# Patient Record
Sex: Male | Born: 1980 | Race: White | Hispanic: No | Marital: Married | Smoking: Never smoker
Health system: Southern US, Community
[De-identification: ages and names within clinical notes are randomized; demographics above are authoritative.]

## PROBLEM LIST (undated history)

## (undated) DIAGNOSIS — I8390 Asymptomatic varicose veins of unspecified lower extremity: Secondary | ICD-10-CM

## (undated) DIAGNOSIS — B019 Varicella without complication: Secondary | ICD-10-CM

## (undated) DIAGNOSIS — I7300 Raynaud's syndrome without gangrene: Secondary | ICD-10-CM

## (undated) DIAGNOSIS — L941 Linear scleroderma: Secondary | ICD-10-CM

## (undated) DIAGNOSIS — I10 Essential (primary) hypertension: Secondary | ICD-10-CM

## (undated) HISTORY — PX: NO PAST SURGERIES: SHX2092

## (undated) HISTORY — DX: Varicella without complication: B01.9

## (undated) HISTORY — DX: Essential (primary) hypertension: I10

## (undated) HISTORY — DX: Linear scleroderma: L94.1

## (undated) HISTORY — DX: Raynaud's syndrome without gangrene: I73.00

## (undated) HISTORY — DX: Asymptomatic varicose veins of unspecified lower extremity: I83.90

---

## 2010-05-26 ENCOUNTER — Emergency Department: Payer: Self-pay | Admitting: Emergency Medicine

## 2012-09-07 ENCOUNTER — Encounter: Payer: Self-pay | Admitting: Adult Health

## 2012-09-07 ENCOUNTER — Ambulatory Visit (INDEPENDENT_AMBULATORY_CARE_PROVIDER_SITE_OTHER): Payer: BC Managed Care – PPO | Admitting: Adult Health

## 2012-09-07 VITALS — BP 114/70 | HR 64 | Temp 98.3°F | Resp 14 | Ht 70.0 in | Wt 179.0 lb

## 2012-09-07 DIAGNOSIS — Z0000 Encounter for general adult medical examination without abnormal findings: Secondary | ICD-10-CM

## 2012-09-07 DIAGNOSIS — Z9189 Other specified personal risk factors, not elsewhere classified: Secondary | ICD-10-CM

## 2012-09-07 DIAGNOSIS — Z87898 Personal history of other specified conditions: Secondary | ICD-10-CM

## 2012-09-07 DIAGNOSIS — Z832 Family history of diseases of the blood and blood-forming organs and certain disorders involving the immune mechanism: Secondary | ICD-10-CM

## 2012-09-07 NOTE — Assessment & Plan Note (Signed)
Patient has been told by his wife that he snores. Discussed having a sleep study to evaluate if he is experiencing any sleep apnea. He is agreeable to have this done at a later time given his busy work schedule at the moment.

## 2012-09-07 NOTE — Patient Instructions (Addendum)
   Thank you for choosing [facility] [place] for your health care needs.  Please return for fasting labs.  The results will be available through MyChart for your convenience. Please remember to activate this. The activation code is located at the end of this form. 

## 2012-09-07 NOTE — Assessment & Plan Note (Signed)
Check factor V leiden

## 2012-09-07 NOTE — Assessment & Plan Note (Signed)
Normal physical exam. Check cbc w/diff, cmet, lipids, tsh. Patient will return for fasting labs.

## 2012-09-07 NOTE — Progress Notes (Signed)
Subjective:    Patient ID: Edwin Brady, male    DOB: 04-05-1980, 32 y.o.   MRN: [id number]  HPI  Patient is a pleasant, healthy 32 year old male who presents to clinic to establish care. Patient recently moved to the area with his family. He has not seen a PCP in several years. He reports that his wife mentions that he snores. He does have periods of fatigue but he attributes these to his work and living with 3 small children. Patient also mentions that he has varicose veins on his right calf that have been evaluated by vascular surgeon. He wears compression socks on occasion but is not really consistent with this.    Past Medical History  Diagnosis Date  . Chicken pox   . Linear scleroderma   . Varicose vein     right calf  . Raynaud disease     Past Surgical Hx: None   Family History  Problem Relation Age of Onset  . Asthma Brother     Childhood asthma    History   Social History  . Marital Status: Married    Spouse Name: Edwin Brady    Number of Children: 3  . Years of Education: 16   Occupational History  . Film maker     [employer]   Social History Main Topics  . Smoking status: Never Smoker   . Smokeless tobacco: Not on file  . Alcohol Use: 0.0 oz/week    8-10 Cans of beer per week  . Drug Use: No  . Sexually Active: Yes -- Male partner(s)   Other Topics Concern  . Not on file   Social History Narrative   Edwin Brady [place] up "[place] over". His father was in the [employer]. He was born in [place]. He has lived in [place], [place], [place]. He graduated from the [employer] with a Bachelor in [place] with a minor in finance. 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 He currently lives in [place] with wife of 5 years and 3 children ages 24, 43 & 8 months. Abdishakur enjoys Diplomatic Services operational officer. He enjoys spending time with the kids. He also enjoys Event organiser. Whenever he has a chance he enjoys running.      No  Medications   Review of Systems  Constitutional: Negative.   HENT: Positive for sneezing.        He has been told by his wife that he snores.  Eyes:       Vision exam every 2 years. He wears eyeglasses without much change in his vision.  Respiratory: Negative.   Cardiovascular: Negative.   Gastrointestinal: Positive for constipation. Negative for nausea, vomiting, abdominal pain, diarrhea and blood in stool.  Endocrine: Negative.   Genitourinary: Negative.   Musculoskeletal: Negative.   Allergic/Immunologic:       Seasonal allergies but very mild  Neurological: Negative.   Hematological: Negative.   Psychiatric/Behavioral: Negative.    BP 114/70  Pulse 64  Temp(Src) 98.3 F (36.8 C)  Resp 14  Ht 5\' 10"  (1.778 m)  Wt 179 lb (81.194 kg)  BMI 25.68 kg/m2    Objective:   Physical Exam  Constitutional: He is oriented to person, place, and time. He appears well-developed and well-nourished. No distress.  HENT:  Head: Normocephalic and atraumatic.  Nose: Nose normal.  Mouth/Throat: Oropharynx is clear and moist.  Erythema and slight inflammation of bilateral ear canals.  Eyes: Conjunctivae and EOM are normal. Pupils  are equal, round, and reactive to light.  Neck: Normal range of motion. Neck supple. No tracheal deviation present. No thyromegaly present.  Cardiovascular: Normal rate, regular rhythm, normal heart sounds and intact distal pulses.  Exam reveals no gallop and no friction rub.   No murmur heard. Right calf varicose veins.  Pulmonary/Chest: Effort normal and breath sounds normal. No respiratory distress. He has no wheezes. He has no rales.  Abdominal: Soft. Bowel sounds are normal. He exhibits no distension and no mass. There is no tenderness. There is no rebound and no guarding.  Musculoskeletal: Normal range of motion. He exhibits no edema and no tenderness.  Lymphadenopathy:    He has no cervical adenopathy.  Neurological: He is alert and oriented to person,  place, and time. He has normal reflexes. He displays normal reflexes. No cranial nerve deficit. He exhibits normal muscle tone. Coordination normal.  Skin: Skin is warm and dry. No rash noted. No erythema. No pallor.  Psychiatric: He has a normal mood and affect. His behavior is normal. Judgment and thought content normal.      Assessment & Plan:

## 2012-09-10 ENCOUNTER — Other Ambulatory Visit (INDEPENDENT_AMBULATORY_CARE_PROVIDER_SITE_OTHER): Payer: BC Managed Care – PPO

## 2012-09-10 DIAGNOSIS — Z0000 Encounter for general adult medical examination without abnormal findings: Secondary | ICD-10-CM

## 2012-09-10 DIAGNOSIS — Z832 Family history of diseases of the blood and blood-forming organs and certain disorders involving the immune mechanism: Secondary | ICD-10-CM

## 2012-09-10 LAB — CBC WITH DIFFERENTIAL/PLATELET
Basophils Relative: 0.7 % (ref 0.0–3.0)
Eosinophils Absolute: 0.3 10*3/uL (ref 0.0–0.7)
Eosinophils Relative: 4.5 % (ref 0.0–5.0)
Hemoglobin: 14.3 g/dL (ref 13.0–17.0)
MCHC: 33.5 g/dL (ref 30.0–36.0)
MCV: 90.4 fl (ref 78.0–100.0)
Monocytes Absolute: 0.5 10*3/uL (ref 0.1–1.0)
Neutro Abs: 2.6 10*3/uL (ref 1.4–7.7)
Neutrophils Relative %: 44.9 % (ref 43.0–77.0)
RBC: 4.74 Mil/uL (ref 4.22–5.81)
WBC: 5.9 10*3/uL (ref 4.5–10.5)

## 2012-09-10 LAB — COMPREHENSIVE METABOLIC PANEL
ALT: 27 U/L (ref 0–53)
AST: 20 U/L (ref 0–37)
Creatinine, Ser: 1 mg/dL (ref 0.4–1.5)
Sodium: 139 mEq/L (ref 135–145)
Total Bilirubin: 0.5 mg/dL (ref 0.3–1.2)
Total Protein: 7.4 g/dL (ref 6.0–8.3)

## 2012-09-10 LAB — LIPID PANEL
HDL: 42.3 mg/dL (ref >39.00)
LDL Cholesterol: 127 mg/dL — ABNORMAL HIGH (ref 0–99)
Total CHOL/HDL Ratio: 4
Triglycerides: 100 mg/dL (ref 0.0–149.0)

## 2012-09-13 ENCOUNTER — Encounter: Payer: Self-pay | Admitting: *Deleted

## 2013-04-28 ENCOUNTER — Ambulatory Visit (INDEPENDENT_AMBULATORY_CARE_PROVIDER_SITE_OTHER): Payer: BC Managed Care – PPO | Admitting: Adult Health

## 2013-04-28 ENCOUNTER — Encounter: Payer: Self-pay | Admitting: Adult Health

## 2013-04-28 ENCOUNTER — Encounter (INDEPENDENT_AMBULATORY_CARE_PROVIDER_SITE_OTHER): Payer: Self-pay

## 2013-04-28 VITALS — BP 118/76 | HR 72 | Temp 98.1°F | Wt 184.0 lb

## 2013-04-28 DIAGNOSIS — H9209 Otalgia, unspecified ear: Secondary | ICD-10-CM

## 2013-04-28 DIAGNOSIS — H9202 Otalgia, left ear: Secondary | ICD-10-CM | POA: Insufficient documentation

## 2013-04-28 MED ORDER — ANTIPYRINE-BENZOCAINE 5.4-1.4 % OT SOLN: 3.0000 [drp] | OTIC | Status: DC | PRN

## 2013-04-28 NOTE — Progress Notes (Signed)
   Subjective:    Patient ID: Edwin Brady, male    DOB: 1980-03-16, 33 y.o.   MRN: [id number]  HPI  Pt is a pleasant 33 y/o male who presents to clinic with left ear pressure, pain and fullness sensation. He reports URI ~ 3 weeks ago which improved. Still feels some congestions but has not taken any decongestants because of the side effects. No fever, swollen lymph nodes or trouble swallowing. No hearing loss.   Past Medical History  Diagnosis Date  . Chicken pox   . Linear scleroderma   . Varicose vein     right calf  . Raynaud disease     Review of Systems  Constitutional: Negative for fever, chills and appetite change.  HENT: Positive for congestion, ear pain and postnasal drip. Negative for ear discharge, hearing loss, sinus pressure and sore throat.   Respiratory: Negative.   Cardiovascular: Negative.   Neurological: Negative for headaches.  Psychiatric/Behavioral: Negative.   All other systems reviewed and are negative.       Objective:   Physical Exam  Constitutional: He is oriented to person, place, and time. He appears well-developed and well-nourished. No distress.  HENT:  Head: Normocephalic and atraumatic.  Bilateral ear canal erythematous and some edema. TM translucent without bulging or evidence of otitis  Cardiovascular: Normal rate and regular rhythm.   Pulmonary/Chest: Effort normal. No respiratory distress.  Lymphadenopathy:    He has no cervical adenopathy.  Neurological: He is alert and oriented to person, place, and time.  Skin: Skin is warm and dry.  Psychiatric: He has a normal mood and affect. His behavior is normal. Judgment and thought content normal.       Assessment & Plan:    1. Ear pain, left No evidence of otitis. Erythema and swelling of canal. Ibuprofen for the next 4 days. Dimetapp Elixir decongestant. Auralgan drops for ear pain. RTC if no improvement.

## 2013-04-28 NOTE — Progress Notes (Signed)
Pre visit review using our clinic review tool, if applicable. No additional management support is needed unless otherwise documented below in the visit note. 

## 2013-04-28 NOTE — Patient Instructions (Signed)
  Use the Auralgan ear drops for pain and discomfort.  Take Dimetapp Elixir as a decongestant. This is very mild and should not give you side effects of regular decongestants. It may cause mild sedation because it has a small dose of benadryl.  Take ibuprofen for the next 4 days for inflammation.  Call if your symptoms are not improved.

## 2014-11-13 ENCOUNTER — Encounter: Payer: Self-pay | Admitting: Nurse Practitioner

## 2014-11-13 ENCOUNTER — Ambulatory Visit (INDEPENDENT_AMBULATORY_CARE_PROVIDER_SITE_OTHER): Payer: BLUE CROSS/BLUE SHIELD | Admitting: Nurse Practitioner

## 2014-11-13 ENCOUNTER — Ambulatory Visit: Payer: Self-pay | Admitting: Nurse Practitioner

## 2014-11-13 VITALS — BP 120/76 | HR 70 | Temp 98.5°F | Resp 18 | Ht 70.0 in | Wt 184.0 lb

## 2014-11-13 DIAGNOSIS — R5382 Chronic fatigue, unspecified: Secondary | ICD-10-CM

## 2014-11-13 DIAGNOSIS — Z131 Encounter for screening for diabetes mellitus: Secondary | ICD-10-CM | POA: Diagnosis not present

## 2014-11-13 DIAGNOSIS — Z23 Encounter for immunization: Secondary | ICD-10-CM | POA: Diagnosis not present

## 2014-11-13 DIAGNOSIS — R7989 Other specified abnormal findings of blood chemistry: Secondary | ICD-10-CM

## 2014-11-13 DIAGNOSIS — Z13220 Encounter for screening for lipoid disorders: Secondary | ICD-10-CM | POA: Diagnosis not present

## 2014-11-13 DIAGNOSIS — Z1329 Encounter for screening for other suspected endocrine disorder: Secondary | ICD-10-CM | POA: Diagnosis not present

## 2014-11-13 DIAGNOSIS — Z202 Contact with and (suspected) exposure to infections with a predominantly sexual mode of transmission: Secondary | ICD-10-CM

## 2014-11-13 DIAGNOSIS — Z130 Encounter for screening for diseases of the blood and blood-forming organs and certain disorders involving the immune mechanism: Secondary | ICD-10-CM

## 2014-11-13 LAB — COMPREHENSIVE METABOLIC PANEL
ALT: 34 U/L (ref 0–53)
AST: 46 U/L — AB (ref 0–37)
Albumin: 4.6 g/dL (ref 3.5–5.2)
Alkaline Phosphatase: 62 U/L (ref 39–117)
BILIRUBIN TOTAL: 0.4 mg/dL (ref 0.2–1.2)
BUN: 16 mg/dL (ref 6–23)
CALCIUM: 9.5 mg/dL (ref 8.4–10.5)
CHLORIDE: 103 meq/L (ref 96–112)
CO2: 28 mEq/L (ref 19–32)
CREATININE: 0.91 mg/dL (ref 0.40–1.50)
GFR: 100.51 mL/min (ref >60.00)
Glucose, Bld: 89 mg/dL (ref 70–99)
Potassium: 4.3 mEq/L (ref 3.5–5.1)
SODIUM: 140 meq/L (ref 135–145)
Total Protein: 7.4 g/dL (ref 6.0–8.3)

## 2014-11-13 LAB — CBC WITH DIFFERENTIAL/PLATELET
BASOS PCT: 0.5 % (ref 0.0–3.0)
Basophils Absolute: 0 10*3/uL (ref 0.0–0.1)
EOS PCT: 1.9 % (ref 0.0–5.0)
Eosinophils Absolute: 0.2 10*3/uL (ref 0.0–0.7)
HEMATOCRIT: 44.5 % (ref 39.0–52.0)
HEMOGLOBIN: 14.9 g/dL (ref 13.0–17.0)
LYMPHS ABS: 2 10*3/uL (ref 0.7–4.0)
Lymphocytes Relative: 24.9 % (ref 12.0–46.0)
MCHC: 33.5 g/dL (ref 30.0–36.0)
MCV: 90.1 fl (ref 78.0–100.0)
MONOS PCT: 7.2 % (ref 3.0–12.0)
Monocytes Absolute: 0.6 10*3/uL (ref 0.1–1.0)
NEUTROS ABS: 5.3 10*3/uL (ref 1.4–7.7)
Neutrophils Relative %: 65.5 % (ref 43.0–77.0)
Platelets: 225 10*3/uL (ref 150.0–400.0)
RBC: 4.94 Mil/uL (ref 4.22–5.81)
RDW: 12.6 % (ref 11.5–15.5)
WBC: 8.1 10*3/uL (ref 4.0–10.5)

## 2014-11-13 LAB — LIPID PANEL
CHOLESTEROL: 192 mg/dL (ref 0–200)
HDL: 45 mg/dL (ref >39.00)
NonHDL: 147.13
Total CHOL/HDL Ratio: 4
Triglycerides: 241 mg/dL — ABNORMAL HIGH (ref 0.0–149.0)
VLDL: 48.2 mg/dL — AB (ref 0.0–40.0)

## 2014-11-13 LAB — LDL CHOLESTEROL, DIRECT: LDL DIRECT: 130 mg/dL

## 2014-11-13 LAB — HEMOGLOBIN A1C: Hgb A1c MFr Bld: 5.6 % (ref 4.6–6.5)

## 2014-11-13 LAB — TSH: TSH: 1.53 u[IU]/mL (ref 0.35–4.50)

## 2014-11-13 NOTE — Addendum Note (Signed)
Addended by: Karlene Einstein D on: 11/13/2014 03:18 PM   Modules accepted: Orders

## 2014-11-13 NOTE — Patient Instructions (Addendum)
Please visit the lab before leaving today.   Make a lab appointment for your testosterone testing- must be first thing in the morning before 9 am.

## 2014-11-13 NOTE — Progress Notes (Signed)
Patient ID: Edwin Brady, male    DOB: [birth year]  Age: 34 y.o. MRN: [id number]  CC: Establish Care   HPI Edwin Brady presents for CC of herpes simplex exposure and feeling sluggish.   1) Herpes simplex- wife has and takes medication to control. He has not experienced an outbreak, but wants to see if he has been exposed.   2) Has feeling sluggish, feels like he has slowed down in the last year. He reports being very busy and his 3 children are keeping him busy. He would like to check his testosterone.   History Edwin Brady has a past medical history of Chicken pox; Linear scleroderma; Varicose vein; Raynaud disease; Chicken pox; and Hypertension.   He has no past surgical history on file.   His family history includes Arthritis in his maternal grandmother, paternal grandfather, and paternal grandmother; Asthma in his brother; Cancer in his maternal grandmother; Heart disease in his maternal grandfather; Hyperlipidemia in his maternal grandfather and paternal grandfather; Hypertension in his maternal grandfather.He reports that he has never smoked. He has never used smokeless tobacco. He reports that he drinks alcohol. He reports that he does not use illicit drugs.  Outpatient Prescriptions Prior to Visit  Medication Sig Dispense Refill  . antipyrine-benzocaine (AURALGAN) otic solution Place 3-4 drops into both ears every 2 (two) hours as needed for ear pain. (Patient not taking: Reported on 11/13/2014) 10 mL 0   No facility-administered medications prior to visit.    ROS Review of Systems  Constitutional: Positive for fatigue. Negative for fever, chills and diaphoresis.  Eyes: Negative for visual disturbance.  Respiratory: Negative for chest tightness, shortness of breath and wheezing.   Cardiovascular: Negative for chest pain, palpitations and leg swelling.  Gastrointestinal: Negative for nausea, vomiting and diarrhea.  Skin: Negative for rash.  Neurological: Negative for dizziness,  weakness and numbness.  Psychiatric/Behavioral: The patient is not nervous/anxious.     Objective:  BP 120/76 mmHg  Pulse 70  Temp(Src) 98.5 F (36.9 C)  Resp 18  Ht 5\' 10"  (1.778 m)  Wt 184 lb (83.462 kg)  BMI 26.40 kg/m2  SpO2 97%  Physical Exam  Constitutional: He is oriented to person, place, and time. He appears well-developed and well-nourished. No distress.  HENT:  Head: Normocephalic and atraumatic.  Right Ear: External ear normal.  Left Ear: External ear normal.  Cardiovascular: Normal rate, regular rhythm and normal heart sounds.   Pulmonary/Chest: Effort normal and breath sounds normal. No respiratory distress. He has no wheezes. He has no rales. He exhibits no tenderness.  Neurological: He is alert and oriented to person, place, and time.  Skin: Skin is warm and dry. No rash noted. He is not diaphoretic.  Psychiatric: He has a normal mood and affect. His behavior is normal. Judgment and thought content normal.   Assessment & Plan:   Edwin Brady was seen today for establish care.  Diagnoses and all orders for this visit:  Exposure to STD -     HSV(herpes simplex vrs) 1+2 ab-IgG -     HSV(herpes simplex vrs) 1+2 ab-IgM  Chronic fatigue -     CBC with Differential/Platelet -     Comprehensive metabolic panel -     Hemoglobin A1c -     Lipid panel -     TSH -     Testosterone, Free, Total, SHBG; Future  Screening for thyroid disorder -     TSH  Screening for hyperlipidemia -     Lipid panel  Screening for diabetes mellitus -     Comprehensive metabolic panel -     Hemoglobin A1c  Screening for deficiency anemia -     CBC with Differential/Platelet  Other orders -     Flu Vaccine QUAD 36+ mos IM   I have discontinued Edwin Brady's antipyrine-benzocaine.  No orders of the defined types were placed in this encounter.     Follow-up: Return in about 4 weeks (around 12/11/2014) for Lab Appointment .

## 2014-11-13 NOTE — Progress Notes (Signed)
Pre visit review using our clinic review tool, if applicable. No additional management support is needed unless otherwise documented below in the visit note. 

## 2014-11-13 NOTE — Addendum Note (Signed)
Addended by: Karlene Einstein D on: 11/13/2014 05:46 PM   Modules accepted: Orders

## 2014-11-13 NOTE — Assessment & Plan Note (Addendum)
Encouraged healthy diet and exercise. Pt reports increase in exercise over last 2 weeks with some benefit. Congratulated him on this effort and encouraged him to keep this up. Will obtain basic labs today. Last labs 2014. Will rule out thyroid d/o, anemia, and diabetes. Will obtain Testosterone level due to feeling sluggish. Future lab visit- repeat if abnl and if 2 abnormals will send to Urology. Pt verbalized understanding

## 2014-11-13 NOTE — Assessment & Plan Note (Signed)
Stable. Pt wife has Herpes simplex type II and is on medication. He would like to be tested for exposure today. No report of an outbreak.

## 2014-11-14 ENCOUNTER — Other Ambulatory Visit: Payer: Self-pay | Admitting: Nurse Practitioner

## 2014-11-14 DIAGNOSIS — R5382 Chronic fatigue, unspecified: Secondary | ICD-10-CM

## 2014-11-14 DIAGNOSIS — R7989 Other specified abnormal findings of blood chemistry: Secondary | ICD-10-CM

## 2014-11-14 LAB — TESTOSTERONE, FREE, TOTAL, SHBG
SEX HORMONE BINDING: 23 nmol/L (ref 10–50)
TESTOSTERONE FREE: 60.5 pg/mL (ref 47.0–244.0)
TESTOSTERONE: 257 ng/dL — AB (ref 300–890)
Testosterone-% Free: 2.4 % (ref 1.6–2.9)

## 2014-11-15 LAB — HSV(HERPES SMPLX)ABS-I+II(IGG+IGM)-BLD
HSV 1 Glycoprotein G Ab, IgG: <0.1 IV
HSV 2 Glycoprotein G Ab, IgG: <0.1 IV
Herpes Simplex Vrs I&II-IgM Ab (EIA): 1.24 INDEX — ABNORMAL HIGH

## 2014-11-17 ENCOUNTER — Other Ambulatory Visit: Payer: Self-pay | Admitting: Nurse Practitioner

## 2014-11-17 ENCOUNTER — Encounter: Payer: Self-pay | Admitting: Nurse Practitioner

## 2014-11-17 DIAGNOSIS — E781 Pure hyperglyceridemia: Secondary | ICD-10-CM

## 2014-11-17 DIAGNOSIS — R5382 Chronic fatigue, unspecified: Secondary | ICD-10-CM

## 2014-11-23 ENCOUNTER — Other Ambulatory Visit (INDEPENDENT_AMBULATORY_CARE_PROVIDER_SITE_OTHER): Payer: BLUE CROSS/BLUE SHIELD

## 2014-11-23 DIAGNOSIS — E781 Pure hyperglyceridemia: Secondary | ICD-10-CM

## 2014-11-23 DIAGNOSIS — R5382 Chronic fatigue, unspecified: Secondary | ICD-10-CM

## 2014-11-23 LAB — TRIGLYCERIDES: TRIGLYCERIDES: 68 mg/dL (ref 0.0–149.0)

## 2014-11-24 LAB — TESTOSTERONE, FREE, TOTAL, SHBG
Sex Hormone Binding: 25 nmol/L (ref 10–50)
TESTOSTERONE: 354 ng/dL (ref 300–890)
Testosterone, Free: 82.5 pg/mL (ref 47.0–244.0)
Testosterone-% Free: 2.3 % (ref 1.6–2.9)

## 2015-01-31 ENCOUNTER — Encounter: Payer: Self-pay | Admitting: Nurse Practitioner

## 2015-01-31 ENCOUNTER — Ambulatory Visit (INDEPENDENT_AMBULATORY_CARE_PROVIDER_SITE_OTHER): Payer: BLUE CROSS/BLUE SHIELD | Admitting: Nurse Practitioner

## 2015-01-31 VITALS — BP 130/88 | HR 68 | Temp 97.8°F | Ht 70.0 in | Wt 186.4 lb

## 2015-01-31 DIAGNOSIS — N4889 Other specified disorders of penis: Secondary | ICD-10-CM

## 2015-01-31 NOTE — Patient Instructions (Signed)
We will let you know results via MyChart.   Happy Holidays!

## 2015-01-31 NOTE — Progress Notes (Signed)
Patient ID: Edwin Brady, male    DOB: 05/12/1980  Age: 34 y.o. MRN: [id number]  CC: Acute Visit   HPI Edwin Brady presents for CC of STD screening.  1) Patient reports he has had unusual sensations from mid shaft of his penis to the head. He describes them as sharp and uncomfortable, lasting for a few seconds and then disappearing, mild to moderate in intensity. He reports he either had chlamydia or gonorrhea back several years ago and has the same sensations he is concerned at this time about having either one of those. He is married and reports he is monogamous  Denies changes in color, problems with initiating or maintaining an erection, scrotal swelling or pain, discharge  History Edwin Brady has a past medical history of Chicken pox; Linear scleroderma; Varicose vein; Raynaud disease; Chicken pox; and Hypertension.   He has no past surgical history on file.   His family history includes Arthritis in his maternal grandmother, paternal grandfather, and paternal grandmother; Asthma in his brother; Cancer in his maternal grandmother; Heart disease in his maternal grandfather; Hyperlipidemia in his maternal grandfather and paternal grandfather; Hypertension in his maternal grandfather.He reports that he has never smoked. He has never used smokeless tobacco. He reports that he drinks alcohol. He reports that he does not use illicit drugs.  No outpatient prescriptions prior to visit.   No facility-administered medications prior to visit.    ROS Review of Systems  Constitutional: Negative for fever, chills, diaphoresis and fatigue.  Genitourinary: Negative for hematuria, discharge, penile swelling, scrotal swelling, difficulty urinating, genital sores, penile pain and testicular pain.    Objective:  BP 130/88 mmHg  Pulse 68  Temp(Src) 97.8 F (36.6 C) (Oral)  Ht 5\' 10"  (1.778 m)  Wt 186 lb 6.4 oz (84.55 kg)  BMI 26.75 kg/m2  Physical Exam  Constitutional: He is oriented to  person, place, and time. He appears well-developed and well-nourished. No distress.  HENT:  Head: Normocephalic and atraumatic.  Right Ear: External ear normal.  Left Ear: External ear normal.  Abdominal: Hernia confirmed negative in the right inguinal area and confirmed negative in the left inguinal area.  Genitourinary: Testes normal and penis normal. Cremasteric reflex is present. Circumcised. No phimosis, paraphimosis, hypospadias, penile erythema or penile tenderness. No discharge found.  Lymphadenopathy:       Right: No inguinal adenopathy present.       Left: No inguinal adenopathy present.  Neurological: He is alert and oriented to person, place, and time.  Skin: Skin is warm and dry. No rash noted. He is not diaphoretic.  Psychiatric: He has a normal mood and affect. His behavior is normal. Judgment and thought content normal.      Assessment & Plan:   Edwin Brady was seen today for acute visit.  Diagnoses and all orders for this visit:  Penile pain -     GC/chlamydia probe amp, urine   Mr. Delanoy does not currently have medications on file.  No orders of the defined types were placed in this encounter.     Follow-up: Return if symptoms worsen or fail to improve.

## 2015-02-01 LAB — GC/CHLAMYDIA PROBE AMP, URINE
Chlamydia, Swab/Urine, PCR: NOT DETECTED
GC PROBE AMP, URINE: NOT DETECTED

## 2015-02-07 DIAGNOSIS — N4889 Other specified disorders of penis: Secondary | ICD-10-CM | POA: Insufficient documentation

## 2015-02-07 NOTE — Assessment & Plan Note (Signed)
Patient denies possible exposure. He reports he is monogamous and there is some difficulty discerning whether he is being truthful but his sexual status. Will obtain GC chlamydia today. Normal penile and testicular exam. FU prn worsening/failure to improve.

## 2015-06-01 ENCOUNTER — Encounter: Payer: Self-pay | Admitting: Nurse Practitioner

## 2015-06-04 ENCOUNTER — Other Ambulatory Visit: Payer: Self-pay | Admitting: Nurse Practitioner

## 2015-06-04 DIAGNOSIS — Z202 Contact with and (suspected) exposure to infections with a predominantly sexual mode of transmission: Secondary | ICD-10-CM

## 2016-06-17 ENCOUNTER — Ambulatory Visit (INDEPENDENT_AMBULATORY_CARE_PROVIDER_SITE_OTHER): Payer: BC Managed Care – PPO | Admitting: Family Medicine

## 2016-06-17 ENCOUNTER — Encounter: Payer: Self-pay | Admitting: Family Medicine

## 2016-06-17 VITALS — BP 128/94 | HR 63 | Temp 98.2°F | Resp 16 | Ht 70.5 in | Wt 196.0 lb

## 2016-06-17 DIAGNOSIS — L941 Linear scleroderma: Secondary | ICD-10-CM | POA: Insufficient documentation

## 2016-06-17 DIAGNOSIS — I7300 Raynaud's syndrome without gangrene: Secondary | ICD-10-CM | POA: Insufficient documentation

## 2016-06-17 DIAGNOSIS — Z0000 Encounter for general adult medical examination without abnormal findings: Secondary | ICD-10-CM | POA: Diagnosis not present

## 2016-06-17 LAB — COMPREHENSIVE METABOLIC PANEL
ALBUMIN: 4.8 g/dL (ref 3.5–5.2)
ALK PHOS: 58 U/L (ref 39–117)
ALT: 41 U/L (ref 0–53)
AST: 26 U/L (ref 0–37)
BUN: 13 mg/dL (ref 6–23)
CALCIUM: 10 mg/dL (ref 8.4–10.5)
CHLORIDE: 103 meq/L (ref 96–112)
CO2: 30 mEq/L (ref 19–32)
Creatinine, Ser: 1.1 mg/dL (ref 0.40–1.50)
GFR: 80.04 mL/min (ref >60.00)
Glucose, Bld: 116 mg/dL — ABNORMAL HIGH (ref 70–99)
POTASSIUM: 4.2 meq/L (ref 3.5–5.1)
SODIUM: 139 meq/L (ref 135–145)
Total Bilirubin: 0.5 mg/dL (ref 0.2–1.2)
Total Protein: 7.8 g/dL (ref 6.0–8.3)

## 2016-06-17 LAB — CBC
HCT: 44.4 % (ref 39.0–52.0)
Hemoglobin: 14.8 g/dL (ref 13.0–17.0)
MCHC: 33.2 g/dL (ref 30.0–36.0)
MCV: 90.5 fl (ref 78.0–100.0)
PLATELETS: 244 10*3/uL (ref 150.0–400.0)
RBC: 4.91 Mil/uL (ref 4.22–5.81)
RDW: 13.2 % (ref 11.5–15.5)
WBC: 5.7 10*3/uL (ref 4.0–10.5)

## 2016-06-17 LAB — HIV ANTIBODY (ROUTINE TESTING W REFLEX): HIV: NONREACTIVE

## 2016-06-17 LAB — LIPID PANEL
Cholesterol: 223 mg/dL — ABNORMAL HIGH (ref 0–200)
HDL: 49.2 mg/dL (ref >39.00)
LDL CALC: 156 mg/dL — AB (ref 0–99)
NONHDL: 173.59
Total CHOL/HDL Ratio: 5
Triglycerides: 88 mg/dL (ref 0.0–149.0)
VLDL: 17.6 mg/dL (ref 0.0–40.0)

## 2016-06-17 LAB — HEMOGLOBIN A1C: Hgb A1c MFr Bld: 5.8 % (ref 4.6–6.5)

## 2016-06-17 NOTE — Assessment & Plan Note (Signed)
Immunizations up-to-date. Screening labs today including HIV.

## 2016-06-17 NOTE — Progress Notes (Signed)
Subjective:  Patient ID: Edwin Brady, male    DOB: 1980/11/12  Age: 36 y.o. MRN: [id number]  CC: Annual physical exam  HPI:  36 year old male presents for an annual physical exam.  Preventative Healthcare  Immunizations - Up to date.   Labs: Labs today.  Alcohol use: See below.  Smoking/tobacco use: Nonsmoker.   STD/HIV testing: Screening HIV today.  PMH, Surgical Hx, Family Hx, Social History reviewed and updated as below.  Past Medical History:  Diagnosis Date  . Chicken pox   . Linear scleroderma   . Raynaud disease   . Varicose vein    right calf   Past Surgical History:  Procedure Laterality Date  . NO PAST SURGERIES     Family History  Problem Relation Age of Onset  . Asthma Brother     Childhood asthma  . Arthritis Maternal Grandmother   . Cancer Maternal Grandmother     Breast Cancer  . Hyperlipidemia Maternal Grandfather   . Heart disease Maternal Grandfather   . Hypertension Maternal Grandfather   . Arthritis Paternal Grandmother   . Arthritis Paternal Grandfather   . Hyperlipidemia Paternal Grandfather    Social History  Substance Use Topics  . Smoking status: Never Smoker  . Smokeless tobacco: Never Used  . Alcohol use Yes     Comment: 2-3 daily   Review of Systems  Psychiatric/Behavioral:       Anxiety/stress.  All other systems reviewed and are negative.  Objective:  BP (!) 128/94 (BP Location: Left Arm, Patient Position: Sitting, Cuff Size: Normal)   Pulse 63   Temp 98.2 F (36.8 C) (Oral)   Resp 16   Ht 5' 10.5" (1.791 m)   Wt 196 lb (88.9 kg)   SpO2 98%   BMI 27.73 kg/m   BP/Weight 06/17/2016 01/31/2015 8/41/6606  Systolic BP 301 601 093  Diastolic BP 94 88 76  Wt. (Lbs) 196 186.4 184  BMI 27.73 26.75 26.4    Physical Exam  Constitutional: He is oriented to person, place, and time. He appears well-developed and well-nourished. No distress.  HENT:  Head: Normocephalic and atraumatic.  Nose: Nose normal.    Mouth/Throat: Oropharynx is clear and moist. No oropharyngeal exudate.  Normal TM's bilaterally.   Eyes: Conjunctivae are normal. No scleral icterus.  Neck: Neck supple.  Cardiovascular: Normal rate and regular rhythm.   No murmur heard. Pulmonary/Chest: Effort normal and breath sounds normal. He has no wheezes. He has no rales.  Abdominal: Soft. He exhibits no distension. There is no tenderness. There is no rebound and no guarding.  Musculoskeletal: Normal range of motion. He exhibits no edema.  Lymphadenopathy:    He has no cervical adenopathy.  Neurological: He is alert and oriented to person, place, and time.  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  Psychiatric: He has a normal mood and affect.  Vitals reviewed.  Lab Results  Component Value Date   WBC 8.1 11/13/2014   HGB 14.9 11/13/2014   HCT 44.5 11/13/2014   PLT 225.0 11/13/2014   GLUCOSE 89 11/13/2014   CHOL 192 11/13/2014   TRIG 68.0 11/23/2014   HDL 45.00 11/13/2014   LDLDIRECT 130.0 11/13/2014   LDLCALC 127 (H) 09/10/2012   ALT 34 11/13/2014   AST 46 (H) 11/13/2014   NA 140 11/13/2014   K 4.3 11/13/2014   CL 103 11/13/2014   CREATININE 0.91 11/13/2014   BUN 16 11/13/2014   CO2 28 11/13/2014   TSH 1.53  11/13/2014   HGBA1C 5.6 11/13/2014    Assessment & Plan:   Problem List Items Addressed This Visit    Annual physical exam - Primary    Immunizations up-to-date. Screening labs today including HIV.      Relevant Orders   CBC   Hemoglobin A1c   Comprehensive metabolic panel   Lipid panel   HIV antibody     Meds ordered this encounter  Medications  . Creatine POWD    Sig: Take by mouth 3 (three) times a week.   Follow-up: Annually  [place]

## 2016-06-17 NOTE — Progress Notes (Signed)
Pre visit review using our clinic review tool, if applicable. No additional management support is needed unless otherwise documented below in the visit note. 

## 2016-06-17 NOTE — Patient Instructions (Signed)
 Health Maintenance, Male A healthy lifestyle and preventive care is important for your health and wellness. Ask your health care provider about what schedule of regular examinations is right for you. What should I know about weight and diet?  Eat a Healthy Diet  Eat plenty of vegetables, fruits, whole grains, low-fat dairy products, and lean protein.  Do not eat a lot of foods high in solid fats, added sugars, or salt. Maintain a Healthy Weight  Regular exercise can help you achieve or maintain a healthy weight. You should:  Do at least 150 minutes of exercise each week. The exercise should increase your heart rate and make you sweat (moderate-intensity exercise).  Do strength-training exercises at least twice a week. Watch Your Levels of Cholesterol and Blood Lipids  Have your blood tested for lipids and cholesterol every 5 years starting at 37 years of age. If you are at high risk for heart disease, you should start having your blood tested when you are 37 years old. You may need to have your cholesterol levels checked more often if:  Your lipid or cholesterol levels are high.  You are older than 37 years of age.  You are at high risk for heart disease. What should I know about cancer screening? Many types of cancers can be detected early and may often be prevented. Lung Cancer  You should be screened every year for lung cancer if:  You are a current smoker who has smoked for at least 30 years.  You are a former smoker who has quit within the past 15 years.  Talk to your health care provider about your screening options, when you should start screening, and how often you should be screened. Colorectal Cancer  Routine colorectal cancer screening usually begins at 37 years of age and should be repeated every 5-10 years until you are 37 years old. You may need to be screened more often if early forms of precancerous polyps or small growths are found. Your health care provider  may recommend screening at an earlier age if you have risk factors for colon cancer.  Your health care provider may recommend using home test kits to check for hidden blood in the stool.  A small camera at the end of a tube can be used to examine your colon (sigmoidoscopy or colonoscopy). This checks for the earliest forms of colorectal cancer. Prostate and Testicular Cancer  Depending on your age and overall health, your health care provider may do certain tests to screen for prostate and testicular cancer.  Talk to your health care provider about any symptoms or concerns you have about testicular or prostate cancer. Skin Cancer  Check your skin from head to toe regularly.  Tell your health care provider about any new moles or changes in moles, especially if:  There is a change in a mole's size, shape, or color.  You have a mole that is larger than a pencil eraser.  Always use sunscreen. Apply sunscreen liberally and repeat throughout the day.  Protect yourself by wearing long sleeves, pants, a wide-brimmed hat, and sunglasses when outside. What should I know about heart disease, diabetes, and high blood pressure?  If you are 18-39 years of age, have your blood pressure checked every 3-5 years. If you are 40 years of age or older, have your blood pressure checked every year. You should have your blood pressure measured twice-once when you are at a hospital or clinic, and once when you are not at   a hospital or clinic. Record the average of the two measurements. To check your blood pressure when you are not at a hospital or clinic, you can use:  An automated blood pressure machine at a pharmacy.  A home blood pressure monitor.  Talk to your health care provider about your target blood pressure.  If you are between 45-79 years old, ask your health care provider if you should take aspirin to prevent heart disease.  Have regular diabetes screenings by checking your fasting blood sugar  level.  If you are at a normal weight and have a low risk for diabetes, have this test once every three years after the age of 45.  If you are overweight and have a high risk for diabetes, consider being tested at a younger age or more often.  A one-time screening for abdominal aortic aneurysm (AAA) by ultrasound is recommended for men aged 65-75 years who are current or former smokers. What should I know about preventing infection? Hepatitis B  If you have a higher risk for hepatitis B, you should be screened for this virus. Talk with your health care provider to find out if you are at risk for hepatitis B infection. Hepatitis C  Blood testing is recommended for:  Everyone born from 1945 through 1965.  Anyone with known risk factors for hepatitis C. Sexually Transmitted Diseases (STDs)  You should be screened each year for STDs including gonorrhea and chlamydia if:  You are sexually active and are younger than 37 years of age.  You are older than 37 years of age and your health care provider tells you that you are at risk for this type of infection.  Your sexual activity has changed since you were last screened and you are at an increased risk for chlamydia or gonorrhea. Ask your health care provider if you are at risk.  Talk with your health care provider about whether you are at high risk of being infected with HIV. Your health care provider may recommend a prescription medicine to help prevent HIV infection. What else can I do?  Schedule regular health, dental, and eye exams.  Stay current with your vaccines (immunizations).  Do not use any tobacco products, such as cigarettes, chewing tobacco, and e-cigarettes. If you need help quitting, ask your health care provider.  Limit alcohol intake to no more than 2 drinks per day. One drink equals 12 ounces of beer, 5 ounces of wine, or 1 ounces of hard liquor.  Do not use street drugs.  Do not share needles.  Ask your health  care provider for help if you need support or information about quitting drugs.  Tell your health care provider if you often feel depressed.  Tell your health care provider if you have ever been abused or do not feel safe at home. This information is not intended to replace advice given to you by your health care provider. Make sure you discuss any questions you have with your health care provider. Document Released: 08/02/2007 Document Revised: 10/03/2015 Document Reviewed: 11/07/2014 Elsevier Interactive Patient Education  2017 Elsevier Inc.  

## 2016-09-24 ENCOUNTER — Encounter: Payer: Self-pay | Admitting: Family Medicine

## 2017-01-22 ENCOUNTER — Ambulatory Visit: Payer: BC Managed Care – PPO | Admitting: Internal Medicine

## 2017-01-22 ENCOUNTER — Encounter: Payer: Self-pay | Admitting: Internal Medicine

## 2017-01-22 DIAGNOSIS — R030 Elevated blood-pressure reading, without diagnosis of hypertension: Secondary | ICD-10-CM | POA: Insufficient documentation

## 2017-01-22 DIAGNOSIS — E785 Hyperlipidemia, unspecified: Secondary | ICD-10-CM | POA: Insufficient documentation

## 2017-01-22 NOTE — Progress Notes (Signed)
Chief Complaint  Patient presents with  . fabry testing [place] new dx 2 weeks ago   Pt presents for f/u  1. His brother was doing IVF and had genetic testing down and +Fabrys and he wants to be tested. Brother just found out 2 weeks. He has 2 brothers and 1 sister and his mother has never been tested. He wants genetic screening test for this  2. BP 140/100 he reports dbp has always been elevated in the past will repeat in 2 weeks.   3. HLD uncontrolled he reports trying to eat healthier but not exercising as much   Review of Systems  Constitutional: Negative for weight loss.  Respiratory: Negative for cough.   Cardiovascular: Negative for chest pain.  Gastrointestinal: Negative for abdominal pain.   Past Medical History:  Diagnosis Date  . Chicken pox   . Linear scleroderma   . Raynaud disease   . Varicose vein    right calf   Past Surgical History:  Procedure Laterality Date  . NO PAST SURGERIES     Family History  Problem Relation Age of Onset  . Asthma Brother        Childhood asthma  . Fabry's disease Brother   . Arthritis Maternal Grandmother   . Cancer Maternal Grandmother        Breast Cancer  . Hyperlipidemia Maternal Grandfather   . Heart disease Maternal Grandfather   . Hypertension Maternal Grandfather   . Arthritis Paternal Grandmother   . Arthritis Paternal Grandfather   . Hyperlipidemia Paternal Grandfather    Social History   Socioeconomic History  . Marital status: Married    Spouse name: Aunchalee  . Number of children: 3  . Years of education: 72  . Highest education level: Not on file  Social Needs  . Financial resource strain: Not on file  . Food insecurity - worry: Not on file  . Food insecurity - inability: Not on file  . Transportation needs - medical: Not on file  . Transportation needs - non-medical: Not on file  Occupational History  . Occupation: Investment banker, operational    Comment: [employer]  Tobacco Use  . Smoking status: Never  Smoker  . Smokeless tobacco: Never Used  Substance and Sexual Activity  . Alcohol use: Yes    Comment: 2-3 daily  . Drug use: No  . Sexual activity: Yes    Partners: Female  Other Topics Concern  . Not on file  Social History Narrative   Edwin Brady [place] up "[place] over". His father was in the [facility]. He was born in [place]. He has lived in [place], [place], [place]. He graduated from the [employer] with a Bachelor in [place] with a minor in finance. 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 He currently lives in [place] with wife of 5 years and 3 children (2 boys, 1 girl). [name]. He enjoys spending time with the kids. He also enjoys Biomedical engineer. Whenever he has a chance he enjoys running.     No outpatient medications have been marked as taking for the 01/22/17 encounter (Office Visit) with McLean-Scocuzza, Nino Glow, MD.   No Known Allergies No results found for this or any previous visit (from the past 2160 hour(s)). Objective  Body mass index is 29 kg/m. Wt Readings from Last 3 Encounters:  01/22/17 205 lb (93 kg)  06/17/16 196 lb (88.9 kg)  01/31/15 186 lb 6.4 oz (  84.6 kg)   Temp Readings from Last 3 Encounters:  01/22/17 97.9 F (36.6 C) (Oral)  06/17/16 98.2 F (36.8 C) (Oral)  01/31/15 97.8 F (36.6 C) (Oral)   BP Readings from Last 3 Encounters:  01/22/17 (!) 134/94  06/17/16 (!) 128/94  01/31/15 130/88   Pulse Readings from Last 3 Encounters:  01/22/17 83  06/17/16 63  01/31/15 68   Pulse oximetry on room air is 99% Physical Exam  Constitutional: He is oriented to person, place, and time and well-developed, well-nourished, and in no distress.  HENT:  Head: Normocephalic and atraumatic.  Mouth/Throat: Oropharynx is clear and moist and mucous membranes are normal.  Eyes: Conjunctivae are normal. Pupils are equal, round, and reactive to light.  Cardiovascular: Normal rate, regular rhythm and normal heart  sounds.  Pulmonary/Chest: Effort normal and breath sounds normal.  Neurological: He is alert and oriented to person, place, and time. Gait normal. Gait normal.  Skin: Skin is warm, dry and intact.  Psychiatric: Mood, memory, affect and judgment normal.  Nursing note and vitals reviewed.  Assessment   1. Screen for fabrys disease  2. Elevated BP per pt diastolic always elevated  3. HLD  4. Hm Plan  1. Check alpha galactosidase A leukocyte assay filled out [facility] sheet if <3% + if 3-35% needs GLA gene testing if >35% no Fabrys  2. Recheck in 2 weeks with labs  CMET, CBC, lipid, UA, TSH, A1C  3. Repeat at f/u fasting  4.  Get flu shot at f/u  Tdap up to date   Labs fasting at f/u  rec increase exercise   Provider: Dr. Olivia Mackie McLean-Scocuzza

## 2017-01-22 NOTE — Progress Notes (Signed)
ge

## 2017-01-22 NOTE — Patient Instructions (Addendum)
Follow up in 2 weeks   Hypertension Hypertension, commonly called high blood pressure, is when the force of blood pumping through the arteries is too strong. The arteries are the blood vessels that carry blood from the heart throughout the body. Hypertension forces the heart to work harder to pump blood and may cause arteries to become narrow or stiff. Having untreated or uncontrolled hypertension can cause heart attacks, strokes, kidney disease, and other problems. A blood pressure reading consists of a higher number over a lower number. Ideally, your blood pressure should be below 120/80. The first ("top") number is called the systolic pressure. It is a measure of the pressure in your arteries as your heart beats. The second ("bottom") number is called the diastolic pressure. It is a measure of the pressure in your arteries as the heart relaxes. What are the causes? The cause of this condition is not known. What increases the risk? Some risk factors for high blood pressure are under your control. Others are not. Factors you can change  Smoking.  Having type 2 diabetes mellitus, high cholesterol, or both.  Not getting enough exercise or physical activity.  Being overweight.  Having too much fat, sugar, calories, or salt (sodium) in your diet.  Drinking too much alcohol. Factors that are difficult or impossible to change  Having chronic kidney disease.  Having a family history of high blood pressure.  Age. Risk increases with age.  Race. You may be at higher risk if you are African-American.  Gender. Men are at higher risk than women before age 46. After age 49, women are at higher risk than men.  Having obstructive sleep apnea.  Stress. What are the signs or symptoms? Extremely high blood pressure (hypertensive crisis) may cause:  Headache.  Anxiety.  Shortness of breath.  Nosebleed.  Nausea and vomiting.  Severe chest pain.  Jerky movements you cannot control  (seizures).  How is this diagnosed? This condition is diagnosed by measuring your blood pressure while you are seated, with your arm resting on a surface. The cuff of the blood pressure monitor will be placed directly against the skin of your upper arm at the level of your heart. It should be measured at least twice using the same arm. Certain conditions can cause a difference in blood pressure between your right and left arms. Certain factors can cause blood pressure readings to be lower or higher than normal (elevated) for a short period of time:  When your blood pressure is higher when you are in a health care provider's office than when you are at home, this is called white coat hypertension. Most people with this condition do not need medicines.  When your blood pressure is higher at home than when you are in a health care provider's office, this is called masked hypertension. Most people with this condition may need medicines to control blood pressure.  If you have a high blood pressure reading during one visit or you have normal blood pressure with other risk factors:  You may be asked to return on a different day to have your blood pressure checked again.  You may be asked to monitor your blood pressure at home for 1 week or longer.  If you are diagnosed with hypertension, you may have other blood or imaging tests to help your health care provider understand your overall risk for other conditions. How is this treated? This condition is treated by making healthy lifestyle changes, such as eating healthy foods, exercising  more, and reducing your alcohol intake. Your health care provider may prescribe medicine if lifestyle changes are not enough to get your blood pressure under control, and if:  Your systolic blood pressure is above 130.  Your diastolic blood pressure is above 80.  Your personal target blood pressure may vary depending on your medical conditions, your age, and other  factors. Follow these instructions at home: Eating and drinking  Eat a diet that is high in fiber and potassium, and low in sodium, added sugar, and fat. An example eating plan is called the DASH (Dietary Approaches to Stop Hypertension) diet. To eat this way: ? Eat plenty of fresh fruits and vegetables. Try to fill half of your plate at each meal with fruits and vegetables. ? Eat whole grains, such as whole wheat pasta, brown rice, or whole grain bread. Fill about one quarter of your plate with whole grains. ? Eat or drink low-fat dairy products, such as skim milk or low-fat yogurt. ? Avoid fatty cuts of meat, processed or cured meats, and poultry with skin. Fill about one quarter of your plate with lean proteins, such as fish, chicken without skin, beans, eggs, and tofu. ? Avoid premade and processed foods. These tend to be higher in sodium, added sugar, and fat.  Reduce your daily sodium intake. Most people with hypertension should eat less than 1,500 mg of sodium a day.  Limit alcohol intake to no more than 1 drink a day for nonpregnant women and 2 drinks a day for men. One drink equals 12 oz of beer, 5 oz of wine, or 1 oz of hard liquor. Lifestyle  Work with your health care provider to maintain a healthy body weight or to lose weight. Ask what an ideal weight is for you.  Get at least 30 minutes of exercise that causes your heart to beat faster (aerobic exercise) most days of the week. Activities may include walking, swimming, or biking.  Include exercise to strengthen your muscles (resistance exercise), such as pilates or lifting weights, as part of your weekly exercise routine. Try to do these types of exercises for 30 minutes at least 3 days a week.  Do not use any products that contain nicotine or tobacco, such as cigarettes and e-cigarettes. If you need help quitting, ask your health care provider.  Monitor your blood pressure at home as told by your health care provider.  Keep  all follow-up visits as told by your health care provider. This is important. Medicines  Take over-the-counter and prescription medicines only as told by your health care provider. Follow directions carefully. Blood pressure medicines must be taken as prescribed.  Do not skip doses of blood pressure medicine. Doing this puts you at risk for problems and can make the medicine less effective.  Ask your health care provider about side effects or reactions to medicines that you should watch for. Contact a health care provider if:  You think you are having a reaction to a medicine you are taking.  You have headaches that keep coming back (recurring).  You feel dizzy.  You have swelling in your ankles.  You have trouble with your vision. Get help right away if:  You develop a severe headache or confusion.  You have unusual weakness or numbness.  You feel faint.  You have severe pain in your chest or abdomen.  You vomit repeatedly.  You have trouble breathing. Summary  Hypertension is when the force of blood pumping through your arteries is  too strong. If this condition is not controlled, it may put you at risk for serious complications.  Your personal target blood pressure may vary depending on your medical conditions, your age, and other factors. For most people, a normal blood pressure is less than 120/80.  Hypertension is treated with lifestyle changes, medicines, or a combination of both. Lifestyle changes include weight loss, eating a healthy, low-sodium diet, exercising more, and limiting alcohol. This information is not intended to replace advice given to you by your health care provider. Make sure you discuss any questions you have with your health care provider. Document Released: 02/03/2005 Document Revised: 01/02/2016 Document Reviewed: 01/02/2016 Elsevier Interactive Patient Education  2018 [employer].  Fabry Disease Fabry disease is a rare genetic disorder. A  gene defect (mutation) alters a protein, called an enzyme, that normally breaks down a particular fat (globotriaosylceramide). The mutation keeps the enzyme from breaking down the fat. This causes excess fat to build up in the blood vessels (vasculature) of many organs of the body. Over time, this results in less blood flow and fewer nutrients getting to the body's organs. The buildup can also wear away at nerve fibers. That causes pain and other symptoms. The areas most often affected are the hands, feet, skin, eyes, and digestive system. Eventually the fatty deposits can affect your cardiovascular system, nervous system, and kidneys. This can cause a heart attack, stroke, kidney damage, or other life-threatening complications. What are the causes? Fabry disease is caused by a genetic mutation. This mutation is passed down through families (inherited). What increases the risk? You may be more likely to have Fabry disease if:  You are male.  You have a family history of the disease.  What are the signs or symptoms? Symptoms of Fabry disease include:  A rash of small, raised, reddish-purple dots on the skin (angiokeratomas).  Cloudy eyes or other eye problems.  Chronic pain, especially in the hands or feet.  Burning sensations in the hands and feet.  Gastrointestinal problems, such as bloating, diarrhea, and frequent bowel movements.  Ringing in the ears (tinnitus).  Hearing loss.  Decreased sweat production (hypohidrosis) or no sweat production (anhidrosis).  Over time, untreated Fabry disease can cause:  Severe back pain in the area of the kidneys.  Heart attack.  Stroke.  How is this diagnosed? Your health care provider may suspect Fabry disease based on your symptoms and family history. Your health care provider will also perform a physical exam. This may include tests, such as:  A blood test to measure enzyme activity in your blood (enzyme assay).  A blood test to look  for mutations in the gene that causes Fabry disease.  Urine tests to check how well your kidneys are working and to check for protein in your urine.  An ultrasound of your heart (echocardiogram) to create an image of your heart and check for heart disease.  If you or your partner has Fabry disease and you are having a child, your child can be screened for the condition at birth. If the results of that test are out of the normal range, the child may need follow-up blood and urine tests. These would help confirm the diagnosis and help determine treatment. Prenatal testing for Fabry disease also can be done. How is this treated? Medicines are used to manage Fabry disease. This may include:  Enzyme replacement therapy. This medicine performs the functions of the enzyme that is missing in Fabry disease.  Medicines to treat  the pain and discomfort in your hands and feet.  You may also need treatment for complications caused by Fabry Disease. These complications may be related to your kidneys or heart. Treatments may include:  Angioplasty to repair or unblock an artery.  Heart surgery.  Dialysis to filter your kidneys.  Kidney transplant.  Follow these instructions at home:  Avoid stressful situations when possible. Stress can worsen the pain related to Fabry disease.  Stay indoors when the temperatures are very hot or very cold.  Exercise carefully, as directed by your health care provider. Physical exertion can make Fabry disease worse.  Practice good hygiene to avoid illnesses that make Fabry disease worse.  Have a good support system in place. Contact a health care provider if:  You have worsening pain.  You have worsening vision.  You have a noticeable decrease in urination.  You have swelling of your hands, feet, or lower legs that is getting worse.  You feel increasingly weak or tired. Get help right away if:  You have trouble breathing, chest pain, or a feeling of  pressure in your chest.  You have stroke symptoms. These include: ? Numbness or weakness on one side of your body. ? Droopiness on one side of your face. ? Slurred speech. ? Confusion.  You have sharp pain in the area of your kidneys. This information is not intended to replace advice given to you by your health care provider. Make sure you discuss any questions you have with your health care provider. Document Released: 01/24/2002 Document Revised: 07/12/2015 Document Reviewed: 09/28/2013 Elsevier Interactive Patient Education  [employer].

## 2017-02-04 ENCOUNTER — Other Ambulatory Visit: Payer: Self-pay | Admitting: Pulmonary Disease

## 2017-02-05 ENCOUNTER — Ambulatory Visit: Payer: BC Managed Care – PPO | Admitting: Internal Medicine

## 2017-02-13 LAB — ALPHA GALACTOSIDASE: ALPHA-GALACTOSIDASE ACTIVITY: 43.5 nmol/h/mg{prot} (ref 28.0–80.0)

## 2017-02-18 ENCOUNTER — Telehealth: Payer: Self-pay

## 2017-02-18 NOTE — Telephone Encounter (Signed)
Left message to return call 

## 2017-02-18 NOTE — Telephone Encounter (Signed)
-----   Message from Delorise Jackson, MD sent at 02/16/2017  7:55 AM EST ----- Negative screening test for Fabry disease   [place]

## 2017-02-23 NOTE — Telephone Encounter (Signed)
Patient advised of result and verbalized an understanding.  

## 2017-02-27 ENCOUNTER — Telehealth: Payer: Self-pay | Admitting: Radiology

## 2017-02-27 NOTE — Telephone Encounter (Signed)
Pt is coming in for labs Monday, please place future orders. Thank you  

## 2017-03-01 ENCOUNTER — Other Ambulatory Visit: Payer: Self-pay | Admitting: Internal Medicine

## 2017-03-01 DIAGNOSIS — R030 Elevated blood-pressure reading, without diagnosis of hypertension: Secondary | ICD-10-CM

## 2017-03-01 DIAGNOSIS — E785 Hyperlipidemia, unspecified: Secondary | ICD-10-CM

## 2017-03-01 DIAGNOSIS — Z1159 Encounter for screening for other viral diseases: Secondary | ICD-10-CM

## 2017-03-01 DIAGNOSIS — Z1329 Encounter for screening for other suspected endocrine disorder: Secondary | ICD-10-CM

## 2017-03-01 DIAGNOSIS — R7303 Prediabetes: Secondary | ICD-10-CM

## 2017-03-02 ENCOUNTER — Other Ambulatory Visit (INDEPENDENT_AMBULATORY_CARE_PROVIDER_SITE_OTHER): Payer: BLUE CROSS/BLUE SHIELD

## 2017-03-02 DIAGNOSIS — R7303 Prediabetes: Secondary | ICD-10-CM

## 2017-03-02 DIAGNOSIS — Z1329 Encounter for screening for other suspected endocrine disorder: Secondary | ICD-10-CM

## 2017-03-02 DIAGNOSIS — R030 Elevated blood-pressure reading, without diagnosis of hypertension: Secondary | ICD-10-CM

## 2017-03-02 DIAGNOSIS — E785 Hyperlipidemia, unspecified: Secondary | ICD-10-CM

## 2017-03-02 DIAGNOSIS — Z1159 Encounter for screening for other viral diseases: Secondary | ICD-10-CM

## 2017-03-02 LAB — LIPID PANEL
CHOLESTEROL: 166 mg/dL (ref 0–200)
HDL: 34.9 mg/dL — ABNORMAL LOW (ref >39.00)
LDL Cholesterol: 110 mg/dL — ABNORMAL HIGH (ref 0–99)
NonHDL: 130.71
TRIGLYCERIDES: 106 mg/dL (ref 0.0–149.0)
Total CHOL/HDL Ratio: 5
VLDL: 21.2 mg/dL (ref 0.0–40.0)

## 2017-03-02 LAB — T4, FREE: Free T4: 0.83 ng/dL (ref 0.60–1.60)

## 2017-03-02 LAB — CBC WITH DIFFERENTIAL/PLATELET
BASOS PCT: 0.7 % (ref 0.0–3.0)
Basophils Absolute: 0 10*3/uL (ref 0.0–0.1)
EOS PCT: 5.6 % — AB (ref 0.0–5.0)
Eosinophils Absolute: 0.3 10*3/uL (ref 0.0–0.7)
HEMATOCRIT: 42.9 % (ref 39.0–52.0)
HEMOGLOBIN: 14.4 g/dL (ref 13.0–17.0)
Lymphocytes Relative: 36.5 % (ref 12.0–46.0)
Lymphs Abs: 1.7 10*3/uL (ref 0.7–4.0)
MCHC: 33.6 g/dL (ref 30.0–36.0)
MCV: 89.5 fl (ref 78.0–100.0)
Monocytes Absolute: 0.5 10*3/uL (ref 0.1–1.0)
Monocytes Relative: 10.9 % (ref 3.0–12.0)
NEUTROS ABS: 2.2 10*3/uL (ref 1.4–7.7)
Neutrophils Relative %: 46.3 % (ref 43.0–77.0)
PLATELETS: 212 10*3/uL (ref 150.0–400.0)
RBC: 4.79 Mil/uL (ref 4.22–5.81)
RDW: 12.6 % (ref 11.5–15.5)
WBC: 4.7 10*3/uL (ref 4.0–10.5)

## 2017-03-02 LAB — TSH: TSH: 2.45 u[IU]/mL (ref 0.35–4.50)

## 2017-03-02 LAB — COMPREHENSIVE METABOLIC PANEL
ALK PHOS: 53 U/L (ref 39–117)
ALT: 26 U/L (ref 0–53)
AST: 18 U/L (ref 0–37)
Albumin: 4.7 g/dL (ref 3.5–5.2)
BUN: 18 mg/dL (ref 6–23)
CO2: 30 meq/L (ref 19–32)
CREATININE: 0.97 mg/dL (ref 0.40–1.50)
Calcium: 9.6 mg/dL (ref 8.4–10.5)
Chloride: 104 mEq/L (ref 96–112)
GFR: 92.19 mL/min (ref >60.00)
GLUCOSE: 129 mg/dL — AB (ref 70–99)
Potassium: 4.4 mEq/L (ref 3.5–5.1)
SODIUM: 140 meq/L (ref 135–145)
TOTAL PROTEIN: 7.4 g/dL (ref 6.0–8.3)
Total Bilirubin: 0.6 mg/dL (ref 0.2–1.2)

## 2017-03-02 LAB — HEMOGLOBIN A1C: HEMOGLOBIN A1C: 5.7 % (ref 4.6–6.5)

## 2017-03-03 LAB — HEPATITIS B SURFACE ANTIGEN: HEP B S AG: NONREACTIVE

## 2017-03-03 LAB — HEPATITIS B SURFACE ANTIBODY, QUANTITATIVE

## 2017-03-05 ENCOUNTER — Telehealth: Payer: Self-pay | Admitting: Internal Medicine

## 2017-03-05 NOTE — Telephone Encounter (Signed)
Copied from [place] [id number]. Topic: Quick Communication - See Telephone Encounter >> Mar 05, 2017  4:18 PM Vernona Rieger wrote: CRM for notification. See Telephone encounter for:   03/05/17.  [facility] stated they need diagnostic codes for lab work done on February 27, 2023 (alpha-galactosidase a) Call back is [phone] reference [id number]

## 2017-03-05 NOTE — Telephone Encounter (Signed)
Please advise,

## 2017-03-06 NOTE — Telephone Encounter (Signed)
Please call back [phone] and [phone]   Thanks Sherman

## 2017-03-06 NOTE — Telephone Encounter (Signed)
Please advise 

## 2017-03-06 NOTE — Telephone Encounter (Signed)
Called [facility] and gave diagnosis codes as listed below spoke with Jenny Reichmann.

## 2017-06-18 ENCOUNTER — Encounter: Payer: BC Managed Care – PPO | Admitting: Family Medicine

## 2018-12-16 ENCOUNTER — Other Ambulatory Visit: Payer: Self-pay

## 2018-12-17 ENCOUNTER — Ambulatory Visit (INDEPENDENT_AMBULATORY_CARE_PROVIDER_SITE_OTHER): Payer: BC Managed Care – PPO

## 2018-12-17 DIAGNOSIS — Z23 Encounter for immunization: Secondary | ICD-10-CM | POA: Diagnosis not present

## 2019-05-13 ENCOUNTER — Encounter: Payer: Self-pay | Admitting: Internal Medicine

## 2019-07-08 ENCOUNTER — Encounter: Payer: Self-pay | Admitting: Internal Medicine

## 2019-07-08 ENCOUNTER — Other Ambulatory Visit: Payer: Self-pay

## 2019-07-08 ENCOUNTER — Ambulatory Visit (INDEPENDENT_AMBULATORY_CARE_PROVIDER_SITE_OTHER): Payer: BC Managed Care – PPO | Admitting: Internal Medicine

## 2019-07-08 VITALS — BP 144/90 | HR 64 | Temp 97.9°F | Ht 70.47 in | Wt 206.6 lb

## 2019-07-08 DIAGNOSIS — R7303 Prediabetes: Secondary | ICD-10-CM | POA: Diagnosis not present

## 2019-07-08 DIAGNOSIS — I10 Essential (primary) hypertension: Secondary | ICD-10-CM | POA: Insufficient documentation

## 2019-07-08 DIAGNOSIS — Z23 Encounter for immunization: Secondary | ICD-10-CM

## 2019-07-08 DIAGNOSIS — Z0001 Encounter for general adult medical examination with abnormal findings: Secondary | ICD-10-CM | POA: Diagnosis not present

## 2019-07-08 DIAGNOSIS — K30 Functional dyspepsia: Secondary | ICD-10-CM | POA: Diagnosis not present

## 2019-07-08 DIAGNOSIS — Z1329 Encounter for screening for other suspected endocrine disorder: Secondary | ICD-10-CM

## 2019-07-08 DIAGNOSIS — R140 Abdominal distension (gaseous): Secondary | ICD-10-CM | POA: Diagnosis not present

## 2019-07-08 DIAGNOSIS — R195 Other fecal abnormalities: Secondary | ICD-10-CM

## 2019-07-08 DIAGNOSIS — E559 Vitamin D deficiency, unspecified: Secondary | ICD-10-CM

## 2019-07-08 DIAGNOSIS — Z1389 Encounter for screening for other disorder: Secondary | ICD-10-CM | POA: Diagnosis not present

## 2019-07-08 DIAGNOSIS — R194 Change in bowel habit: Secondary | ICD-10-CM

## 2019-07-08 DIAGNOSIS — Z0000 Encounter for general adult medical examination without abnormal findings: Secondary | ICD-10-CM

## 2019-07-08 DIAGNOSIS — Z1283 Encounter for screening for malignant neoplasm of skin: Secondary | ICD-10-CM

## 2019-07-08 LAB — CBC WITH DIFFERENTIAL/PLATELET
Basophils Absolute: 0 10*3/uL (ref 0.0–0.1)
Basophils Relative: 0.5 % (ref 0.0–3.0)
Eosinophils Absolute: 0.3 10*3/uL (ref 0.0–0.7)
Eosinophils Relative: 4.2 % (ref 0.0–5.0)
HCT: 41.8 % (ref 39.0–52.0)
Hemoglobin: 14.4 g/dL (ref 13.0–17.0)
Lymphocytes Relative: 29 % (ref 12.0–46.0)
Lymphs Abs: 1.8 10*3/uL (ref 0.7–4.0)
MCHC: 34.5 g/dL (ref 30.0–36.0)
MCV: 89.2 fl (ref 78.0–100.0)
Monocytes Absolute: 0.7 10*3/uL (ref 0.1–1.0)
Monocytes Relative: 10.8 % (ref 3.0–12.0)
Neutro Abs: 3.5 10*3/uL (ref 1.4–7.7)
Neutrophils Relative %: 55.5 % (ref 43.0–77.0)
Platelets: 204 10*3/uL (ref 150.0–400.0)
RBC: 4.68 Mil/uL (ref 4.22–5.81)
RDW: 13.3 % (ref 11.5–15.5)
WBC: 6.3 10*3/uL (ref 4.0–10.5)

## 2019-07-08 LAB — COMPREHENSIVE METABOLIC PANEL
ALT: 39 U/L (ref 0–53)
AST: 25 U/L (ref 0–37)
Albumin: 4.7 g/dL (ref 3.5–5.2)
Alkaline Phosphatase: 64 U/L (ref 39–117)
BUN: 13 mg/dL (ref 6–23)
CO2: 29 mEq/L (ref 19–32)
Calcium: 9.5 mg/dL (ref 8.4–10.5)
Chloride: 103 mEq/L (ref 96–112)
Creatinine, Ser: 0.93 mg/dL (ref 0.40–1.50)
GFR: 89.95 mL/min (ref >60.00)
Glucose, Bld: 105 mg/dL — ABNORMAL HIGH (ref 70–99)
Potassium: 4 mEq/L (ref 3.5–5.1)
Sodium: 138 mEq/L (ref 135–145)
Total Bilirubin: 0.6 mg/dL (ref 0.2–1.2)
Total Protein: 7.2 g/dL (ref 6.0–8.3)

## 2019-07-08 LAB — LIPID PANEL
Cholesterol: 222 mg/dL — ABNORMAL HIGH (ref 0–200)
HDL: 47.8 mg/dL (ref >39.00)
LDL Cholesterol: 154 mg/dL — ABNORMAL HIGH (ref 0–99)
NonHDL: 173.93
Total CHOL/HDL Ratio: 5
Triglycerides: 101 mg/dL (ref 0.0–149.0)
VLDL: 20.2 mg/dL (ref 0.0–40.0)

## 2019-07-08 LAB — HEMOGLOBIN A1C: Hgb A1c MFr Bld: 5.8 % (ref 4.6–6.5)

## 2019-07-08 LAB — TSH: TSH: 2.16 u[IU]/mL (ref 0.35–4.50)

## 2019-07-08 LAB — VITAMIN D 25 HYDROXY (VIT D DEFICIENCY, FRACTURES): VITD: 28.89 ng/mL — ABNORMAL LOW (ref 30.00–100.00)

## 2019-07-08 MED ORDER — TETANUS-DIPHTH-ACELL PERTUSSIS 5-2.5-18.5 LF-MCG/0.5 IM SUSP: 0.5000 mL | Freq: Once | INTRAMUSCULAR | 0 refills | Status: AC

## 2019-07-08 MED ORDER — AMLODIPINE BESYLATE 2.5 MG PO TABS: 2.5000 mg | ORAL_TABLET | Freq: Every day | ORAL | 3 refills | Status: DC

## 2019-07-08 NOTE — Progress Notes (Signed)
Chief Complaint  Patient presents with  . Annual Exam   Annual  1. HTN noted reviewed with pt agreeable to tx has been monitoring BP at home and slightly above 130s 2. C/o change in bowel habits no blood stool more narrow, gas/bloating, mucous in stool  3. Wants referral dermatology    Review of Systems  Constitutional: Negative for weight loss.  HENT: Negative for hearing loss.   Eyes: Negative for blurred vision.  Respiratory: Negative for shortness of breath.   Cardiovascular: Negative for chest pain.  Gastrointestinal: Negative for abdominal pain, blood in stool, constipation and diarrhea.       +gas and change in bowel habits   Musculoskeletal: Negative for falls.  Skin: Negative for rash.  Neurological: Negative for headaches.  Psychiatric/Behavioral: Negative for depression.   Past Medical History:  Diagnosis Date  . Chicken pox   . Linear scleroderma   . Raynaud disease   . Varicose vein    right calf   Past Surgical History:  Procedure Laterality Date  . NO PAST SURGERIES     Family History  Problem Relation Age of Onset  . Asthma Brother        Childhood asthma  . Fabry's disease Brother   . Arthritis Maternal Grandmother   . Cancer Maternal Grandmother        Breast Cancer  . Hyperlipidemia Maternal Grandfather   . Heart disease Maternal Grandfather   . Hypertension Maternal Grandfather   . Arthritis Paternal Grandmother   . Arthritis Paternal Grandfather   . Hyperlipidemia Paternal Grandfather   . Clotting disorder Paternal Aunt        after covid d/o   . Celiac disease Other    Social History   Socioeconomic History  . Marital status: Married    Spouse name: Aunchalee  . Number of children: 3  . Years of education: 37  . Highest education level: Not on file  Occupational History  . Occupation: Investment banker, operational    Comment: [employer]  Tobacco Use  . Smoking status: Never Smoker  . Smokeless tobacco: Never Used  Substance and Sexual  Activity  . Alcohol use: Yes    Comment: 2-3 daily  . Drug use: No  . Sexual activity: Yes    Partners: Female  Other Topics Concern  . Not on file  Social History Narrative   Shirley [place] up "[place] over". His father was in the [employer]. He was born in [place]. He has lived in [place], [place], [place]. He graduated from the [employer] with a Bachelor in [place] with a minor in finance. 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 He currently lives in [place] with wife of 5 years and 3 children (2 boys, 1 girl). Chriss enjoys Estate agent. He enjoys spending time with the kids. He also enjoys Biomedical engineer. Whenever he has a chance he enjoys running.     Social Determinants of Health   Financial Resource Strain:   . Difficulty of Paying Living Expenses:   Food Insecurity:   . Worried About Charity fundraiser in the Last Year:   . Arboriculturist in the Last Year:   Transportation Needs:   . Film/video editor (Medical):   Marland Kitchen Lack of Transportation (Non-Medical):   Physical Activity:   . Days of Exercise per Week:   . Minutes of Exercise per Session:   Stress:   . Feeling  of Stress :   Social Connections:   . Frequency of Communication with Friends and Family:   . Frequency of Social Gatherings with Friends and Family:   . Attends Religious Services:   . Active Member of Clubs or Organizations:   . Attends Archivist Meetings:   Marland Kitchen Marital Status:   Intimate Partner Violence:   . Fear of Current or Ex-Partner:   . Emotionally Abused:   Marland Kitchen Physically Abused:   . Sexually Abused:    No outpatient medications have been marked as taking for the 07/08/19 encounter (Office Visit) with McLean-Scocuzza, Nino Glow, MD.   No Known Allergies No results found for this or any previous visit (from the past 2160 hour(s)). Objective  Body mass index is 29.25 kg/m. Wt Readings from Last 3 Encounters:  07/08/19 206 lb 9.6 oz (93.7 kg)   01/22/17 205 lb (93 kg)  06/17/16 196 lb (88.9 kg)   Temp Readings from Last 3 Encounters:  07/08/19 97.9 F (36.6 C) (Temporal)  01/22/17 97.9 F (36.6 C) (Oral)  06/17/16 98.2 F (36.8 C) (Oral)   BP Readings from Last 3 Encounters:  07/08/19 (!) 144/90  01/22/17 (!) 134/94  06/17/16 (!) 128/94   Pulse Readings from Last 3 Encounters:  07/08/19 64  01/22/17 83  06/17/16 63    Physical Exam Vitals and nursing note reviewed.  Constitutional:      Appearance: Normal appearance. He is well-developed, well-groomed and overweight.  HENT:     Head: Normocephalic and atraumatic.  Eyes:     Conjunctiva/sclera: Conjunctivae normal.     Pupils: Pupils are equal, round, and reactive to light.  Cardiovascular:     Rate and Rhythm: Normal rate and regular rhythm.     Heart sounds: Normal heart sounds. No murmur.  Pulmonary:     Effort: Pulmonary effort is normal.     Breath sounds: Normal breath sounds.  Skin:    General: Skin is warm and dry.  Neurological:     General: No focal deficit present.     Mental Status: He is alert and oriented to person, place, and time. Mental status is at baseline.     Gait: Gait normal.  Psychiatric:        Attention and Perception: Attention and perception normal.        Mood and Affect: Mood and affect normal.        Speech: Speech normal.        Behavior: Behavior normal. Behavior is cooperative.        Thought Content: Thought content normal.        Cognition and Memory: Cognition and memory normal.        Judgment: Judgment normal.     Assessment  Plan   Annual  Flu utd Tdap up to date  covid 2/2 moderna lat 06/11/19 Hep B immune   Labs fasting today rec increase exercise and healthy diet choices Referral Dr. Allen Norris  Referral derm  Pt not smoking  Essential hypertension - Plan: Comprehensive metabolic panel, Lipid panel, CBC with Differential/Platelet, amLODipine (NORVASC) 2.5 MG tablet  Change in bowel habits - Plan:  Ambulatory referral to Gastroenterology Dr. Allen Norris Bloating - Plan: Ambulatory referral to Gastroenterology Indigestion - Plan: Ambulatory referral to Gastroenterology Mucus in stool - Plan: Ambulatory referral to Gastroenterology     Provider: Dr. Olivia Mackie McLean-Scocuzza-Internal Medicine

## 2019-07-08 NOTE — Patient Instructions (Addendum)
Prediabetes Eating Plan Prediabetes is a condition that causes blood sugar (glucose) levels to be higher than normal. This increases the risk for developing diabetes. In order to prevent diabetes from developing, your health care provider may recommend a diet and other lifestyle changes to help you:  Control your blood glucose levels.  Improve your cholesterol levels.  Manage your blood pressure. Your health care provider may recommend working with a diet and nutrition specialist (dietitian) to make a meal plan that is best for you. What are tips for following this plan? Lifestyle  Set weight loss goals with the help of your health care team. It is recommended that most people with prediabetes lose 7% of their current body weight.  Exercise for at least 30 minutes at least 5 days a week.  Attend a support group or seek ongoing support from a mental health counselor.  Take over-the-counter and prescription medicines only as told by your health care provider. Reading food labels  Read food labels to check the amount of fat, salt (sodium), and sugar in prepackaged foods. Avoid foods that have: ? Saturated fats. ? Trans fats. ? Added sugars.  Avoid foods that have more than 300 milligrams (mg) of sodium per serving. Limit your daily sodium intake to less than 2,300 mg each day. Shopping  Avoid buying pre-made and processed foods. Cooking  Cook with olive oil. Do not use butter, lard, or ghee.  Bake, broil, grill, or boil foods. Avoid frying. Meal planning   Work with your dietitian to develop an eating plan that is right for you. This may include: ? Tracking how many calories you take in. Use a food diary, notebook, or mobile application to track what you eat at each meal. ? Using the glycemic index (GI) to plan your meals. The index tells you how quickly a food will raise your blood glucose. Choose low-GI foods. These foods take a longer time to raise blood glucose.  Consider  following a Mediterranean diet. This diet includes: ? Several servings each day of fresh fruits and vegetables. ? Eating fish at least twice a week. ? Several servings each day of whole grains, beans, nuts, and seeds. ? Using olive oil instead of other fats. ? Moderate alcohol consumption. ? Eating small amounts of red meat and whole-fat dairy.  If you have high blood pressure, you may need to limit your sodium intake or follow a diet such as the DASH eating plan. DASH is an eating plan that aims to lower high blood pressure. What foods are recommended? The items listed below may not be a complete list. Talk with your dietitian about what dietary choices are best for you. Grains Whole grains, such as whole-wheat or whole-grain breads, crackers, cereals, and pasta. Unsweetened oatmeal. Bulgur. Barley. Quinoa. Brown rice. Corn or whole-wheat flour tortillas or taco shells. Vegetables Lettuce. Spinach. Peas. Beets. Cauliflower. Cabbage. Broccoli. Carrots. Tomatoes. Squash. Eggplant. Herbs. Peppers. Onions. Cucumbers. Brussels sprouts. Fruits Berries. Bananas. Apples. Oranges. Grapes. Papaya. Mango. Pomegranate. Kiwi. Grapefruit. Cherries. Meats and other protein foods Seafood. Poultry without skin. Lean cuts of pork and beef. Tofu. Eggs. Nuts. Beans. Dairy Low-fat or fat-free dairy products, such as yogurt, cottage cheese, and cheese. Beverages Water. Tea. Coffee. Sugar-free or diet soda. Seltzer water. Lowfat or no-fat milk. Milk alternatives, such as soy or almond milk. Fats and oils Olive oil. Canola oil. Sunflower oil. Grapeseed oil. Avocado. Walnuts. Sweets and desserts Sugar-free or low-fat pudding. Sugar-free or low-fat ice cream and other frozen treats.   Seasoning and other foods Herbs. Sodium-free spices. Mustard. Relish. Low-fat, low-sugar ketchup. Low-fat, low-sugar barbecue sauce. Low-fat or fat-free mayonnaise. What foods are not recommended? The items listed below may not be a  complete list. Talk with your dietitian about what dietary choices are best for you. Grains Refined white flour and flour products, such as bread, pasta, snack foods, and cereals. Vegetables Canned vegetables. Frozen vegetables with butter or cream sauce. Fruits Fruits canned with syrup. Meats and other protein foods Fatty cuts of meat. Poultry with skin. Breaded or fried meat. Processed meats. Dairy Full-fat yogurt, cheese, or milk. Beverages Sweetened drinks, such as sweet iced tea and soda. Fats and oils Butter. Lard. Ghee. Sweets and desserts Baked goods, such as cake, cupcakes, pastries, cookies, and cheesecake. Seasoning and other foods Spice mixes with added salt. Ketchup. Barbecue sauce. Mayonnaise. Summary  To prevent diabetes from developing, you may need to make diet and other lifestyle changes to help control blood sugar, improve cholesterol levels, and manage your blood pressure.  Set weight loss goals with the help of your health care team. It is recommended that most people with prediabetes lose 7 percent of their current body weight.  Consider following a Mediterranean diet that includes plenty of fresh fruits and vegetables, whole grains, beans, nuts, seeds, fish, lean meat, low-fat dairy, and healthy oils. This information is not intended to replace advice given to you by your health care provider. Make sure you discuss any questions you have with your health care provider. Document Revised: 05/28/2018 Document Reviewed: 04/09/2016 Elsevier Patient Education  [place].  Amlodipine Oral Tablets What is this medicine? AMLODIPINE (am LOE di peen) is a calcium channel blocker. It relaxes your blood vessels and decreases the amount of work the heart has to do. It treats high blood pressure and/or prevents chest pain (also called angina). This medicine may be used for other purposes; ask your health care provider or pharmacist if you have questions. COMMON  BRAND NAME(S): Norvasc What should I tell my health care provider before I take this medicine? They need to know if you have any of these conditions:  heart disease  liver disease  an unusual or allergic reaction to amlodipine, other drugs, foods, dyes, or preservatives  pregnant or trying to get pregnant  breast-feeding How should I use this medicine? Take this drug by mouth. Take it as directed on the prescription label at the same time every day. You can take it with or without food. If it upsets your stomach, take it with food. Keep taking it unless your health care provider tells you to stop. Talk to your health care provider about the use of this drug in children. While it may be prescribed for children as young as 6 for selected conditions, precautions do apply. Overdosage: If you think you have taken too much of this medicine contact a poison control center or emergency room at once. NOTE: This medicine is only for you. Do not share this medicine with others. What if I miss a dose? If you miss a dose, take it as soon as you can. If it is almost time for your next dose, take only that dose. Do not take double or extra doses. What may interact with this medicine? This medicine may interact with the following medications:  clarithromycin  cyclosporine  diltiazem  itraconazole  simvastatin  tacrolimus This list may not describe all possible interactions. Give your health care provider a list of all the medicines, herbs,  non-prescription drugs, or dietary supplements you use. Also tell them if you smoke, drink alcohol, or use illegal drugs. Some items may interact with your medicine. What should I watch for while using this medicine? Visit your health care provider for regular checks on your progress. Check your blood pressure as directed. Ask your health care provider what your blood pressure should be. Also, find out when you should contact him or her. Do not treat yourself  for coughs, colds, or pain while you are using this drug without asking your health care provider for advice. Some drugs may increase your blood pressure. You may get drowsy or dizzy. Do not drive, use machinery, or do anything that needs mental alertness until you know how this drug affects you. Do not stand up or sit up quickly, especially if you are an older patient. This reduces the risk of dizzy or fainting spells. What side effects may I notice from receiving this medicine? Side effects that you should report to your doctor or health care provider as soon as possible:  allergic reactions (skin rash, itching or hives; swelling of the face, lips, or tongue)  heart attack (trouble breathing; pain or tightness in the chest, neck, back or arms; unusually weak or tired)  low blood pressure (dizziness; feeling faint or lightheaded, falls; unusually weak or tired) Side effects that usually do not require medical attention (report these to your doctor or health care provider if they continue or are bothersome):  facial flushing  nausea  palpitations  stomach pain  sudden weight gain  swelling of the ankles, feet, hands This list may not describe all possible side effects. Call your doctor for medical advice about side effects. You may report side effects to FDA at 1-800-FDA-1088. Where should I keep my medicine? Keep out of the reach of children and pets. Store at room temperature between 59 and 86 degrees F (15 and 30 degrees C). Protect from light and moisture. Keep the container tightly closed. Throw away any unused drug after the expiration date. NOTE: This sheet is a summary. It may not cover all possible information. If you have questions about this medicine, talk to your doctor, pharmacist, or health care provider.  2020 Elsevier/Gold Standard (2018-11-09 19:39:45)  DASH Eating Plan DASH stands for "Dietary Approaches to Stop Hypertension." The DASH eating plan is a healthy eating  plan that has been shown to reduce high blood pressure (hypertension). It may also reduce your risk for type 2 diabetes, heart disease, and stroke. The DASH eating plan may also help with weight loss. What are tips for following this plan?  General guidelines  Avoid eating more than 2,300 mg (milligrams) of salt (sodium) a day. If you have hypertension, you may need to reduce your sodium intake to 1,500 mg a day.  Limit alcohol intake to no more than 1 drink a day for nonpregnant women and 2 drinks a day for men. One drink equals 12 oz of beer, 5 oz of wine, or 1 oz of hard liquor.  Work with your health care provider to maintain a healthy body weight or to lose weight. Ask what an ideal weight is for you.  Get at least 30 minutes of exercise that causes your heart to beat faster (aerobic exercise) most days of the week. Activities may include walking, swimming, or biking.  Work with your health care provider or diet and nutrition specialist (dietitian) to adjust your eating plan to your individual calorie needs. Reading food labels  Check food labels for the amount of sodium per serving. Choose foods with less than 5 percent of the Daily Value of sodium. Generally, foods with less than 300 mg of sodium per serving fit into this eating plan.  To find whole grains, look for the word "whole" as the first word in the ingredient list. Shopping  Buy products labeled as "low-sodium" or "no salt added."  Buy fresh foods. Avoid canned foods and premade or frozen meals. Cooking  Avoid adding salt when cooking. Use salt-free seasonings or herbs instead of table salt or sea salt. Check with your health care provider or pharmacist before using salt substitutes.  Do not fry foods. Cook foods using healthy methods such as baking, boiling, grilling, and broiling instead.  Cook with heart-healthy oils, such as olive, canola, soybean, or sunflower oil. Meal planning  Eat a balanced diet that  includes: ? 5 or more servings of fruits and vegetables each day. At each meal, try to fill half of your plate with fruits and vegetables. ? Up to 6-8 servings of whole grains each day. ? Less than 6 oz of lean meat, poultry, or fish each day. A 3-oz serving of meat is about the same size as a deck of cards. One egg equals 1 oz. ? 2 servings of low-fat dairy each day. ? A serving of nuts, seeds, or beans 5 times each week. ? Heart-healthy fats. Healthy fats called Omega-3 fatty acids are found in foods such as flaxseeds and coldwater fish, like sardines, salmon, and mackerel.  Limit how much you eat of the following: ? Canned or prepackaged foods. ? Food that is high in trans fat, such as fried foods. ? Food that is high in saturated fat, such as fatty meat. ? Sweets, desserts, sugary drinks, and other foods with added sugar. ? Full-fat dairy products.  Do not salt foods before eating.  Try to eat at least 2 vegetarian meals each week.  Eat more home-cooked food and less restaurant, buffet, and fast food.  When eating at a restaurant, ask that your food be prepared with less salt or no salt, if possible. What foods are recommended? The items listed may not be a complete list. Talk with your dietitian about what dietary choices are best for you. Grains Whole-grain or whole-wheat bread. Whole-grain or whole-wheat pasta. Brown rice. [place]. Bulgur. Whole-grain and low-sodium cereals. Pita bread. Low-fat, low-sodium crackers. Whole-wheat flour tortillas. Vegetables Fresh or frozen vegetables (raw, steamed, roasted, or grilled). Low-sodium or reduced-sodium tomato and vegetable juice. Low-sodium or reduced-sodium tomato sauce and tomato paste. Low-sodium or reduced-sodium canned vegetables. Fruits All fresh, dried, or frozen fruit. Canned fruit in natural juice (without added sugar). Meat and other protein foods Skinless chicken or [place]. Ground chicken or [place]. Pork with fat  trimmed off. Fish and seafood. Egg whites. Dried beans, peas, or lentils. Unsalted nuts, nut butters, and seeds. Unsalted canned beans. Lean cuts of beef with fat trimmed off. Low-sodium, lean deli meat. Dairy Low-fat (1%) or fat-free (skim) milk. Fat-free, low-fat, or reduced-fat cheeses. Nonfat, low-sodium ricotta or cottage cheese. Low-fat or nonfat yogurt. Low-fat, low-sodium cheese. Fats and oils Soft margarine without trans fats. Vegetable oil. Low-fat, reduced-fat, or light mayonnaise and salad dressings (reduced-sodium). Canola, safflower, olive, soybean, and sunflower oils. Avocado. Seasoning and other foods Herbs. Spices. Seasoning mixes without salt. Unsalted popcorn and pretzels. Fat-free sweets. What foods are not recommended? The items listed may not be a complete list. Talk with your dietitian about  what dietary choices are best for you. Grains Baked goods made with fat, such as croissants, muffins, or some breads. Dry pasta or rice meal packs. Vegetables Creamed or fried vegetables. Vegetables in a cheese sauce. Regular canned vegetables (not low-sodium or reduced-sodium). Regular canned tomato sauce and paste (not low-sodium or reduced-sodium). Regular tomato and vegetable juice (not low-sodium or reduced-sodium). Angie Fava. Olives. Fruits Canned fruit in a light or heavy syrup. Fried fruit. Fruit in cream or butter sauce. Meat and other protein foods Fatty cuts of meat. Ribs. Fried meat. Berniece Salines. Sausage. Bologna and other processed lunch meats. Salami. Fatback. Hotdogs. Bratwurst. Salted nuts and seeds. Canned beans with added salt. Canned or smoked fish. Whole eggs or egg yolks. Chicken or [place] with skin. Dairy Whole or 2% milk, cream, and half-and-half. Whole or full-fat cream cheese. Whole-fat or sweetened yogurt. Full-fat cheese. Nondairy creamers. Whipped toppings. Processed cheese and cheese spreads. Fats and oils Butter. Stick margarine. Lard. Shortening. Ghee. Bacon fat.  Tropical oils, such as coconut, palm kernel, or palm oil. Seasoning and other foods Salted popcorn and pretzels. Onion salt, garlic salt, seasoned salt, table salt, and sea salt. Worcestershire sauce. Tartar sauce. Barbecue sauce. Teriyaki sauce. Soy sauce, including reduced-sodium. Steak sauce. Canned and packaged gravies. Fish sauce. Oyster sauce. Cocktail sauce. Horseradish that you find on the shelf. Ketchup. Mustard. Meat flavorings and tenderizers. Bouillon cubes. Hot sauce and Tabasco sauce. Premade or packaged marinades. Premade or packaged taco seasonings. Relishes. Regular salad dressings. Where to find more information:  National Heart, Lung, and [place]: https://wilson-eaton.com/  American Heart Association: www.heart.org Summary  The DASH eating plan is a healthy eating plan that has been shown to reduce high blood pressure (hypertension). It may also reduce your risk for type 2 diabetes, heart disease, and stroke.  With the DASH eating plan, you should limit salt (sodium) intake to 2,300 mg a day. If you have hypertension, you may need to reduce your sodium intake to 1,500 mg a day.  When on the DASH eating plan, aim to eat more fresh fruits and vegetables, whole grains, lean proteins, low-fat dairy, and heart-healthy fats.  Work with your health care provider or diet and nutrition specialist (dietitian) to adjust your eating plan to your individual calorie needs. This information is not intended to replace advice given to you by your health care provider. Make sure you discuss any questions you have with your health care provider. Document Revised: 01/16/2017 Document Reviewed: 01/28/2016 Elsevier Patient Education  [place].  Hypertension, Adult High blood pressure (hypertension) is when the force of blood pumping through the arteries is too strong. The arteries are the blood vessels that carry blood from the heart throughout the body. Hypertension forces the heart to  work harder to pump blood and may cause arteries to become narrow or stiff. Untreated or uncontrolled hypertension can cause a heart attack, heart failure, a stroke, kidney disease, and other problems. A blood pressure reading consists of a higher number over a lower number. Ideally, your blood pressure should be below 120/80. The first ("top") number is called the systolic pressure. It is a measure of the pressure in your arteries as your heart beats. The second ("bottom") number is called the diastolic pressure. It is a measure of the pressure in your arteries as the heart relaxes. What are the causes? The exact cause of this condition is not known. There are some conditions that result in or are related to high blood pressure. What increases the  risk? Some risk factors for high blood pressure are under your control. The following factors may make you more likely to develop this condition:  Smoking.  Having type 2 diabetes mellitus, high cholesterol, or both.  Not getting enough exercise or physical activity.  Being overweight.  Having too much fat, sugar, calories, or salt (sodium) in your diet.  Drinking too much alcohol. Some risk factors for high blood pressure may be difficult or impossible to change. Some of these factors include:  Having chronic kidney disease.  Having a family history of high blood pressure.  Age. Risk increases with age.  Race. You may be at higher risk if you are African American.  Gender. Men are at higher risk than women before age 9. After age 81, women are at higher risk than men.  Having obstructive sleep apnea.  Stress. What are the signs or symptoms? High blood pressure may not cause symptoms. Very high blood pressure (hypertensive crisis) may cause:  Headache.  Anxiety.  Shortness of breath.  Nosebleed.  Nausea and vomiting.  Vision changes.  Severe chest pain.  Seizures. How is this diagnosed? This condition is diagnosed by  measuring your blood pressure while you are seated, with your arm resting on a flat surface, your legs uncrossed, and your feet flat on the floor. The cuff of the blood pressure monitor will be placed directly against the skin of your upper arm at the level of your heart. It should be measured at least twice using the same arm. Certain conditions can cause a difference in blood pressure between your right and left arms. Certain factors can cause blood pressure readings to be lower or higher than normal for a short period of time:  When your blood pressure is higher when you are in a health care provider's office than when you are at home, this is called white coat hypertension. Most people with this condition do not need medicines.  When your blood pressure is higher at home than when you are in a health care provider's office, this is called masked hypertension. Most people with this condition may need medicines to control blood pressure. If you have a high blood pressure reading during one visit or you have normal blood pressure with other risk factors, you may be asked to:  Return on a different day to have your blood pressure checked again.  Monitor your blood pressure at home for 1 week or longer. If you are diagnosed with hypertension, you may have other blood or imaging tests to help your health care provider understand your overall risk for other conditions. How is this treated? This condition is treated by making healthy lifestyle changes, such as eating healthy foods, exercising more, and reducing your alcohol intake. Your health care provider may prescribe medicine if lifestyle changes are not enough to get your blood pressure under control, and if:  Your systolic blood pressure is above 130.  Your diastolic blood pressure is above 80. Your personal target blood pressure may vary depending on your medical conditions, your age, and other factors. Follow these instructions at  home: Eating and drinking   Eat a diet that is high in fiber and potassium, and low in sodium, added sugar, and fat. An example eating plan is called the DASH (Dietary Approaches to Stop Hypertension) diet. To eat this way: ? Eat plenty of fresh fruits and vegetables. Try to fill one half of your plate at each meal with fruits and vegetables. ? Eat whole  grains, such as whole-wheat pasta, brown rice, or whole-grain bread. Fill about one fourth of your plate with whole grains. ? Eat or drink low-fat dairy products, such as skim milk or low-fat yogurt. ? Avoid fatty cuts of meat, processed or cured meats, and poultry with skin. Fill about one fourth of your plate with lean proteins, such as fish, chicken without skin, beans, eggs, or tofu. ? Avoid pre-made and processed foods. These tend to be higher in sodium, added sugar, and fat.  Reduce your daily sodium intake. Most people with hypertension should eat less than 1,500 mg of sodium a day.  Do not drink alcohol if: ? Your health care provider tells you not to drink. ? You are pregnant, may be pregnant, or are planning to become pregnant.  If you drink alcohol: ? Limit how much you use to:  0-1 drink a day for women.  0-2 drinks a day for men. ? Be aware of how much alcohol is in your drink. In the [place] one drink equals one 12 oz bottle of beer (355 mL), one 5 oz glass of wine (148 mL), or one 1 oz glass of hard liquor (44 mL). Lifestyle   Work with your health care provider to maintain a healthy body weight or to lose weight. Ask what an ideal weight is for you.  Get at least 30 minutes of exercise most days of the week. Activities may include walking, swimming, or biking.  Include exercise to strengthen your muscles (resistance exercise), such as Pilates or lifting weights, as part of your weekly exercise routine. Try to do these types of exercises for 30 minutes at least 3 days a week.  Do not use any products that contain  nicotine or tobacco, such as cigarettes, e-cigarettes, and chewing tobacco. If you need help quitting, ask your health care provider.  Monitor your blood pressure at home as told by your health care provider.  Keep all follow-up visits as told by your health care provider. This is important. Medicines  Take over-the-counter and prescription medicines only as told by your health care provider. Follow directions carefully. Blood pressure medicines must be taken as prescribed.  Do not skip doses of blood pressure medicine. Doing this puts you at risk for problems and can make the medicine less effective.  Ask your health care provider about side effects or reactions to medicines that you should watch for. Contact a health care provider if you:  Think you are having a reaction to a medicine you are taking.  Have headaches that keep coming back (recurring).  Feel dizzy.  Have swelling in your ankles.  Have trouble with your vision. Get help right away if you:  Develop a severe headache or confusion.  Have unusual weakness or numbness.  Feel faint.  Have severe pain in your chest or abdomen.  Vomit repeatedly.  Have trouble breathing. Summary  Hypertension is when the force of blood pumping through your arteries is too strong. If this condition is not controlled, it may put you at risk for serious complications.  Your personal target blood pressure may vary depending on your medical conditions, your age, and other factors. For most people, a normal blood pressure is less than 120/80.  Hypertension is treated with lifestyle changes, medicines, or a combination of both. Lifestyle changes include losing weight, eating a healthy, low-sodium diet, exercising more, and limiting alcohol. This information is not intended to replace advice given to you by your health care provider. Make  sure you discuss any questions you have with your health care provider. Document Revised: 10/14/2017  Document Reviewed: 10/14/2017 Elsevier Patient Education  [place].   https://www.cdc.gov/vaccines/hcp/vis/vis-statements/tdap.pdf">  Tdap (Tetanus, Diphtheria, Pertussis) Vaccine: What You Need to Know 1. Why get vaccinated? Tdap vaccine can prevent tetanus, diphtheria, and pertussis. Diphtheria and pertussis spread from person to person. Tetanus enters the body through cuts or wounds.  TETANUS (T) causes painful stiffening of the muscles. Tetanus can lead to serious health problems, including being unable to open the mouth, having trouble swallowing and breathing, or death.  DIPHTHERIA (D) can lead to difficulty breathing, heart failure, paralysis, or death.  PERTUSSIS (aP), also known as "whooping cough," can cause uncontrollable, violent coughing which makes it hard to breathe, eat, or drink. Pertussis can be extremely serious in babies and young children, causing pneumonia, convulsions, brain damage, or death. In teens and adults, it can cause weight loss, loss of bladder control, passing out, and rib fractures from severe coughing. 2. Tdap vaccine Tdap is only for children 7 years and older, adolescents, and adults.  Adolescents should receive a single dose of Tdap, preferably at age 26 or 23 years. Pregnant women should get a dose of Tdap during every pregnancy, to protect the newborn from pertussis. Infants are most at risk for severe, life-threatening complications from pertussis. Adults who have never received Tdap should get a dose of Tdap. Also, adults should receive a booster dose every 10 years, or earlier in the case of a severe and dirty wound or burn. Booster doses can be either Tdap or Td (a different vaccine that protects against tetanus and diphtheria but not pertussis). Tdap may be given at the same time as other vaccines. 3. Talk with your health care provider Tell your vaccine provider if the person getting the vaccine:  Has had an allergic reaction after a  previous dose of any vaccine that protects against tetanus, diphtheria, or pertussis, or has any severe, life-threatening allergies.  Has had a coma, decreased level of consciousness, or prolonged seizures within 7 days after a previous dose of any pertussis vaccine (DTP, DTaP, or Tdap).  Has seizures or another nervous system problem.  Has ever had Guillain-Barr Syndrome (also called GBS).  Has had severe pain or swelling after a previous dose of any vaccine that protects against tetanus or diphtheria. In some cases, your health care provider may decide to postpone Tdap vaccination to a future visit.  People with minor illnesses, such as a cold, may be vaccinated. People who are moderately or severely ill should usually wait until they recover before getting Tdap vaccine.  Your health care provider can give you more information. 4. Risks of a vaccine reaction  Pain, redness, or swelling where the shot was given, mild fever, headache, feeling tired, and nausea, vomiting, diarrhea, or stomachache sometimes happen after Tdap vaccine. People sometimes faint after medical procedures, including vaccination. Tell your provider if you feel dizzy or have vision changes or ringing in the ears.  As with any medicine, there is a very remote chance of a vaccine causing a severe allergic reaction, other serious injury, or death. 5. What if there is a serious problem? An allergic reaction could occur after the vaccinated person leaves the clinic. If you see signs of a severe allergic reaction (hives, swelling of the face and throat, difficulty breathing, a fast heartbeat, dizziness, or weakness), call 9-1-1 and get the person to the nearest hospital. For other signs that concern you,  call your health care provider.  Adverse reactions should be reported to the Vaccine Adverse Event Reporting System (VAERS). Your health care provider will usually file this report, or you can do it yourself. Visit the VAERS  website at www.vaers.SamedayNews.es or call (334)602-9567. VAERS is only for reporting reactions, and VAERS staff do not give medical advice. 6. The National Vaccine Injury Compensation Program The [employer] Vaccine Injury Compensation Program (VICP) is a federal program that was created to compensate people who may have been injured by certain vaccines. Visit the VICP website at GoldCloset.com.ee or call 316-538-5048 to learn about the program and about filing a claim. There is a time limit to file a claim for compensation. 7. How can I learn more?  Ask your health care provider.  Call your local or state health department.  Contact the Centers for Disease Control and Prevention (CDC): ? Call 5746735828 (1-800-CDC-INFO) or ? Visit CDC's website at http://hunter.com/ Vaccine Information Statement Tdap (Tetanus, Diphtheria, Pertussis) Vaccine (05/19/2018) This information is not intended to replace advice given to you by your health care provider. Make sure you discuss any questions you have with your health care provider. Document Revised: 05/28/2018 Document Reviewed: 05/31/2018 Elsevier Patient Education  [place].

## 2019-07-09 LAB — URINALYSIS, ROUTINE W REFLEX MICROSCOPIC
Bilirubin Urine: NEGATIVE
Glucose, UA: NEGATIVE
Hgb urine dipstick: NEGATIVE
Ketones, ur: NEGATIVE
Leukocytes,Ua: NEGATIVE
Nitrite: NEGATIVE
Protein, ur: NEGATIVE
Specific Gravity, Urine: 1.009 (ref 1.001–1.03)
pH: <=5 (ref 5.0–8.0)

## 2019-08-18 ENCOUNTER — Encounter: Payer: Self-pay | Admitting: Gastroenterology

## 2019-08-18 ENCOUNTER — Other Ambulatory Visit: Payer: Self-pay

## 2019-08-18 ENCOUNTER — Ambulatory Visit (INDEPENDENT_AMBULATORY_CARE_PROVIDER_SITE_OTHER): Payer: BC Managed Care – PPO | Admitting: Gastroenterology

## 2019-08-18 VITALS — BP 117/78 | HR 76 | Temp 98.2°F | Ht 70.0 in | Wt 206.4 lb

## 2019-08-18 DIAGNOSIS — R194 Change in bowel habit: Secondary | ICD-10-CM | POA: Diagnosis not present

## 2019-08-18 NOTE — Progress Notes (Signed)
Gastroenterology Consultation  Referring Provider:     McLean-Scocuzza, Olivia Mackie * Primary Care Physician:  McLean-Scocuzza, Nino Glow, MD Primary Gastroenterologist:  Dr. Allen Norris     Reason for Consultation:     Change in bowel habits        HPI:   Edwin Brady is a 39 y.o. y/o male referred for consultation & management of change in bowel habits by Dr. Terese Door, Nino Glow, MD.  This patient comes in today after seeing his primary care provider in May with a report of a change in bowel habits with the stools being more narrow.  The patient was also reporting some gas and bloating at that time and the stool had a new finding of mucus in the stools.  The patient reports the symptoms are associated with the bloating mention before and he also has been having indigestion.  The patient has a family history of Fabry's disease in his brother but he was screened and was found to be negative.  The patient was found to have a low vitamin D with increased total cholesterol and normal liver enzymes.  The patient reports that his symptoms started right after he started a diet with a replacement drink that he has subsequently stopped but continues to have narrower stools.  He denies any family history of colon cancer or colon polyps.  He also denies any nausea vomiting fevers or chills.  There is no report of any unexplained weight loss.  The patient also denies any associated abdominal pain with his change in bowel habits.  The patient endorses that the mucus with his bowel movements has stopped.   Past Medical History:  Diagnosis Date  . Chicken pox   . Linear scleroderma   . Raynaud disease   . Varicose vein    right calf    Past Surgical History:  Procedure Laterality Date  . NO PAST SURGERIES      Prior to Admission medications   Medication Sig Start Date End Date Taking? Authorizing Provider  amLODipine (NORVASC) 2.5 MG tablet Take 1 tablet (2.5 mg total) by mouth daily. 07/08/19    McLean-Scocuzza, Nino Glow, MD    Family History  Problem Relation Age of Onset  . Asthma Brother        Childhood asthma  . Fabry's disease Brother   . Arthritis Maternal Grandmother   . Cancer Maternal Grandmother        Breast Cancer  . Hyperlipidemia Maternal Grandfather   . Heart disease Maternal Grandfather   . Hypertension Maternal Grandfather   . Arthritis Paternal Grandmother   . Arthritis Paternal Grandfather   . Hyperlipidemia Paternal Grandfather   . Clotting disorder Paternal Aunt        after covid d/o   . Celiac disease Other      Social History   Tobacco Use  . Smoking status: Never Smoker  . Smokeless tobacco: Never Used  Substance Use Topics  . Alcohol use: Yes    Comment: 2-3 daily  . Drug use: No    Allergies as of 08/18/2019  . (No Known Allergies)    Review of Systems:    All systems reviewed and negative except where noted in HPI.   Physical Exam:  There were no vitals taken for this visit. No LMP for male patient. General:   Alert,  Well-developed, well-nourished, pleasant and cooperative in NAD Head:  Normocephalic and atraumatic. Eyes:  Sclera clear, no icterus.   Conjunctiva pink. Ears:  Normal auditory acuity. Neck:  Supple; no masses or thyromegaly. Lungs:  Respirations even and unlabored.  Clear throughout to auscultation.   No wheezes, crackles, or rhonchi. No acute distress. Heart:  Regular rate and rhythm; no murmurs, clicks, rubs, or gallops. Abdomen:  Normal bowel sounds.  No bruits.  Soft, non-tender and non-distended without masses, hepatosplenomegaly or hernias noted.  No guarding or rebound tenderness.  Negative Carnett sign.   Rectal:  Deferred.  Pulses:  Normal pulses noted. Extremities:  No clubbing or edema.  No cyanosis. Neurologic:  Alert and oriented x3;  grossly normal neurologically. Skin:  Intact without significant lesions or rashes.  No jaundice. Lymph Nodes:  No significant cervical adenopathy. Psych:  Alert  and cooperative. Normal mood and affect.  Imaging Studies: No results found.  Assessment and Plan:   [name] is a 39 y.o. y/o male who comes in today with a history of a change in bowel habits with passing of mucus and reported that his stools were more narrow.  He denies any acute infection prior to the symptoms starting or use of antibiotics.  He did start a meal replacement drink for weight loss about the time this all started but he has subsequently stopped that without resolution of his stool changes.  The patient has been told that since his stools have become more narrow he should be set up for a colonoscopy.  The patient will be set up for a colonoscopy. I have discussed risks & benefits which include, but are not limited to, bleeding, infection, perforation & drug reaction.  The patient agrees with this plan & written consent will be obtained.       Lucilla Lame, MD. Marval Regal    Note: This dictation was prepared with Dragon dictation along with smaller phrase technology. Any transcriptional errors that result from this process are unintentional.

## 2019-10-07 ENCOUNTER — Ambulatory Visit: Payer: BC Managed Care – PPO | Admitting: Internal Medicine

## 2019-10-13 ENCOUNTER — Ambulatory Visit: Payer: BC Managed Care – PPO | Admitting: Dermatology

## 2019-10-13 ENCOUNTER — Other Ambulatory Visit: Admission: RE | Admit: 2019-10-13 | Payer: BC Managed Care – PPO | Source: Ambulatory Visit

## 2019-10-17 ENCOUNTER — Ambulatory Visit
Admission: RE | Admit: 2019-10-17 | Payer: BC Managed Care – PPO | Source: Home / Self Care | Admitting: Gastroenterology

## 2019-10-17 ENCOUNTER — Encounter: Admission: RE | Payer: Self-pay | Source: Home / Self Care

## 2019-10-17 SURGERY — COLONOSCOPY WITH PROPOFOL
Anesthesia: Choice

## 2019-10-26 ENCOUNTER — Encounter: Payer: Self-pay | Admitting: Internal Medicine

## 2019-10-26 ENCOUNTER — Ambulatory Visit (INDEPENDENT_AMBULATORY_CARE_PROVIDER_SITE_OTHER): Payer: BC Managed Care – PPO | Admitting: Internal Medicine

## 2019-10-26 ENCOUNTER — Other Ambulatory Visit: Payer: Self-pay

## 2019-10-26 VITALS — BP 122/84 | HR 61 | Temp 98.2°F | Ht 70.0 in | Wt 194.4 lb

## 2019-10-26 DIAGNOSIS — Z1159 Encounter for screening for other viral diseases: Secondary | ICD-10-CM

## 2019-10-26 DIAGNOSIS — R7303 Prediabetes: Secondary | ICD-10-CM | POA: Diagnosis not present

## 2019-10-26 DIAGNOSIS — K30 Functional dyspepsia: Secondary | ICD-10-CM

## 2019-10-26 DIAGNOSIS — E663 Overweight: Secondary | ICD-10-CM

## 2019-10-26 DIAGNOSIS — Z23 Encounter for immunization: Secondary | ICD-10-CM | POA: Diagnosis not present

## 2019-10-26 DIAGNOSIS — E785 Hyperlipidemia, unspecified: Secondary | ICD-10-CM | POA: Diagnosis not present

## 2019-10-26 DIAGNOSIS — R140 Abdominal distension (gaseous): Secondary | ICD-10-CM

## 2019-10-26 DIAGNOSIS — I10 Essential (primary) hypertension: Secondary | ICD-10-CM

## 2019-10-26 DIAGNOSIS — E559 Vitamin D deficiency, unspecified: Secondary | ICD-10-CM

## 2019-10-26 DIAGNOSIS — R194 Change in bowel habit: Secondary | ICD-10-CM

## 2019-10-26 DIAGNOSIS — Z13818 Encounter for screening for other digestive system disorders: Secondary | ICD-10-CM

## 2019-10-26 DIAGNOSIS — L03114 Cellulitis of left upper limb: Secondary | ICD-10-CM

## 2019-10-26 DIAGNOSIS — R195 Other fecal abnormalities: Secondary | ICD-10-CM

## 2019-10-26 DIAGNOSIS — M795 Residual foreign body in soft tissue: Secondary | ICD-10-CM

## 2019-10-26 MED ORDER — DOXYCYCLINE HYCLATE 100 MG PO TABS: 100.0000 mg | ORAL_TABLET | Freq: Two times a day (BID) | ORAL | 0 refills | Status: DC

## 2019-10-26 MED ORDER — AMLODIPINE BESYLATE 5 MG PO TABS: 5.0000 mg | ORAL_TABLET | Freq: Every day | ORAL | 3 refills | Status: DC

## 2019-10-26 MED ORDER — MUPIROCIN 2 % EX OINT: 1.0000 "application " | TOPICAL_OINTMENT | Freq: Two times a day (BID) | CUTANEOUS | 0 refills | Status: DC

## 2019-10-26 NOTE — Patient Instructions (Signed)
Debrox ear wax drops  Fasting labs 01/08/20   Cellulitis, Adult  Cellulitis is a skin infection. The infected area is usually warm, red, swollen, and tender. This condition occurs most often in the arms and lower legs. The infection can travel to the muscles, blood, and underlying tissue and become serious. It is very important to get treated for this condition. What are the causes? Cellulitis is caused by bacteria. The bacteria enter through a break in the skin, such as a cut, burn, insect bite, open sore, or crack. What increases the risk? This condition is more likely to occur in people who:  Have a weak body defense system (immune system).  Have open wounds on the skin, such as cuts, burns, bites, and scrapes. Bacteria can enter the body through these open wounds.  Are older than 40 years of age.  Have diabetes.  Have a type of long-lasting (chronic) liver disease (cirrhosis) or kidney disease.  Are obese.  Have a skin condition such as: ? Itchy rash (eczema). ? Slow movement of blood in the veins (venous stasis). ? Fluid buildup below the skin (edema).  Have had radiation therapy.  Use IV drugs. What are the signs or symptoms? Symptoms of this condition include:  Redness, streaking, or spotting on the skin.  Swollen area of the skin.  Tenderness or pain when an area of the skin is touched.  Warm skin.  A fever.  Chills.  Blisters. How is this diagnosed? This condition is diagnosed based on a medical history and physical exam. You may also have tests, including:  Blood tests.  Imaging tests. How is this treated? Treatment for this condition may include:  Medicines, such as antibiotic medicines or medicines to treat allergies (antihistamines).  Supportive care, such as rest and application of cold or warm cloths (compresses) to the skin.  Hospital care, if the condition is severe. The infection usually starts to get better within 1-2 days of  treatment. Follow these instructions at home:  Medicines  Take over-the-counter and prescription medicines only as told by your health care provider.  If you were prescribed an antibiotic medicine, take it as told by your health care provider. Do not stop taking the antibiotic even if you start to feel better. General instructions  Drink enough fluid to keep your urine pale yellow.  Do not touch or rub the infected area.  Raise (elevate) the infected area above the level of your heart while you are sitting or lying down.  Apply warm or cold compresses to the affected area as told by your health care provider.  Keep all follow-up visits as told by your health care provider. This is important. These visits let your health care provider make sure a more serious infection is not developing. Contact a health care provider if:  You have a fever.  Your symptoms do not begin to improve within 1-2 days of starting treatment.  Your bone or joint underneath the infected area becomes painful after the skin has healed.  Your infection returns in the same area or another area.  You notice a swollen bump in the infected area.  You develop new symptoms.  You have a general ill feeling (malaise) with muscle aches and pains. Get help right away if:  Your symptoms get worse.  You feel very sleepy.  You develop vomiting or diarrhea that persists.  You notice red streaks coming from the infected area.  Your red area gets larger or turns dark in color.  These symptoms may represent a serious problem that is an emergency. Do not wait to see if the symptoms will go away. Get medical help right away. Call your local emergency services (911 in the [place] Do not drive yourself to the hospital. Summary  Cellulitis is a skin infection. This condition occurs most often in the arms and lower legs.  Treatment for this condition may include medicines, such as antibiotic medicines or  antihistamines.  Take over-the-counter and prescription medicines only as told by your health care provider. If you were prescribed an antibiotic medicine, do not stop taking the antibiotic even if you start to feel better.  Contact a health care provider if your symptoms do not begin to improve within 1-2 days of starting treatment or your symptoms get worse.  Keep all follow-up visits as told by your health care provider. This is important. These visits let your health care provider make sure that a more serious infection is not developing. This information is not intended to replace advice given to you by your health care provider. Make sure you discuss any questions you have with your health care provider. Document Revised: 06/25/2017 Document Reviewed: 06/25/2017 Elsevier Patient Education  [place].

## 2019-10-26 NOTE — Progress Notes (Signed)
Chief Complaint  Patient presents with  . Follow-up  . Immunizations    flu shot   F/u  1. HTN and norvasc 2.5 mg qd. BP at home 121-139/80s   2. Left forearm 3 days ago had a piece of wood lodged in arm and thinks maybe some residual area is red though redness reduced and ttp and sore nothing tried   3. GI wants to go to [facility] Mucus in stool,Change in bowel habits,Bloating,Indigetion  4. Wants flu shot today   Review of Systems  Constitutional: Positive for weight loss.       Wt down 12 lbs   HENT: Negative for hearing loss.   Eyes: Negative for blurred vision.  Respiratory: Negative for shortness of breath.   Cardiovascular: Negative for chest pain.  Gastrointestinal:       +see HPI  Musculoskeletal: Negative for falls and joint pain.  Skin: Positive for rash.  Neurological: Negative for headaches.  Psychiatric/Behavioral: Negative for depression.   Past Medical History:  Diagnosis Date  . Chicken pox   . Linear scleroderma   . Raynaud disease   . Varicose vein    right calf   Past Surgical History:  Procedure Laterality Date  . NO PAST SURGERIES     Family History  Problem Relation Age of Onset  . Asthma Brother        Childhood asthma  . Fabry's disease Brother   . Arthritis Maternal Grandmother   . Cancer Maternal Grandmother        Breast Cancer  . Hyperlipidemia Maternal Grandfather   . Heart disease Maternal Grandfather   . Hypertension Maternal Grandfather   . Arthritis Paternal Grandmother   . Arthritis Paternal Grandfather   . Hyperlipidemia Paternal Grandfather   . Clotting disorder Paternal Aunt        after covid d/o   . Celiac disease Other    Social History   Socioeconomic History  . Marital status: Married    Spouse name: Edwin Brady  . Number of children: 3  . Years of education: 57  . Highest education level: Not on file  Occupational History  . Occupation: Investment banker, operational    Comment: [employer]  Tobacco Use  . Smoking  status: Never Smoker  . Smokeless tobacco: Never Used  Substance and Sexual Activity  . Alcohol use: Yes    Comment: 2-3 daily  . Drug use: No  . Sexual activity: Yes    Partners: Female  Other Topics Concern  . Not on file  Social History Narrative   Edwin Brady [place] up "[place] over". His father was in the [facility] He was born in [place]. He has lived in [place], [place], [place]. He graduated from the [employer] with a Bachelor in [place] with a minor in finance. 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 He currently lives in [place] with wife of 5 years and 3 children (2 boys, 1 girl). Edwin Brady enjoys Estate agent. He enjoys spending time with the kids. He also enjoys Biomedical engineer. Whenever he has a chance he enjoys running.     Social Determinants of Health   Financial Resource Strain:   . Difficulty of Paying Living Expenses: Not on file  Food Insecurity:   . Worried About Charity fundraiser in the Last Year: Not on file  . Ran Out of Food in the Last Year: Not on file  Transportation Needs:   .  Lack of Transportation (Medical): Not on file  . Lack of Transportation (Non-Medical): Not on file  Physical Activity:   . Days of Exercise per Week: Not on file  . Minutes of Exercise per Session: Not on file  Stress:   . Feeling of Stress : Not on file  Social Connections:   . Frequency of Communication with Friends and Family: Not on file  . Frequency of Social Gatherings with Friends and Family: Not on file  . Attends Religious Services: Not on file  . Active Member of Clubs or Organizations: Not on file  . Attends Archivist Meetings: Not on file  . Marital Status: Not on file  Intimate Partner Violence:   . Fear of Current or Ex-Partner: Not on file  . Emotionally Abused: Not on file  . Physically Abused: Not on file  . Sexually Abused: Not on file   Current Meds  Medication Sig  . amLODipine (NORVASC) 5 MG tablet Take 1 tablet  (5 mg total) by mouth daily. D/c 2.5  . [DISCONTINUED] amLODipine (NORVASC) 2.5 MG tablet Take 1 tablet (2.5 mg total) by mouth daily.   No Known Allergies No results found for this or any previous visit (from the past 2160 hour(s)). Objective  Body mass index is 27.89 kg/m. Wt Readings from Last 3 Encounters:  10/26/19 194 lb 6.4 oz (88.2 kg)  08/18/19 206 lb 6.4 oz (93.6 kg)  07/08/19 206 lb 9.6 oz (93.7 kg)   Temp Readings from Last 3 Encounters:  10/26/19 98.2 F (36.8 C) (Oral)  08/18/19 98.2 F (36.8 C) (Temporal)  07/08/19 97.9 F (36.6 C) (Temporal)   BP Readings from Last 3 Encounters:  10/26/19 122/84  08/18/19 117/78  07/08/19 (!) 144/90   Pulse Readings from Last 3 Encounters:  10/26/19 61  08/18/19 76  07/08/19 64    Physical Exam Vitals and nursing note reviewed.  Constitutional:      Appearance: Normal appearance. He is well-developed, well-groomed and overweight.  HENT:     Head: Normocephalic and atraumatic.  Eyes:     Conjunctiva/sclera: Conjunctivae normal.     Pupils: Pupils are equal, round, and reactive to light.  Cardiovascular:     Rate and Rhythm: Normal rate and regular rhythm.     Heart sounds: Normal heart sounds. No murmur heard.   Pulmonary:     Effort: Pulmonary effort is normal.     Breath sounds: Normal breath sounds.  Skin:    General: Skin is warm and dry.       Neurological:     General: No focal deficit present.     Mental Status: He is alert and oriented to person, place, and time. Mental status is at baseline.     Gait: Gait normal.  Psychiatric:        Attention and Perception: Attention and perception normal.        Mood and Affect: Mood and affect normal.        Speech: Speech normal.        Behavior: Behavior normal. Behavior is cooperative.        Thought Content: Thought content normal.        Cognition and Memory: Cognition and memory normal.        Judgment: Judgment normal.     Assessment  Plan    Essential hypertension - Plan: amLODipine (NORVASC) 5 MG tablet increased from 2.5, Lipid panel, Comprehensive metabolic panel, CBC with Differential/Platelet Monitor BP  Prediabetes - Plan: Hemoglobin A1c  Hyperlipidemia, unspecified hyperlipidemia type Healthy diet and exercise   Left arm cellulitis - Plan: doxycycline (VIBRA-TABS) 100 MG tablet, mupirocin ointment (BACTROBAN) 2 % Consider gen surgery or derm consult if does not improve as he thinks piece of wood stuck in left arm    Mucus in stool - Plan: Ambulatory referral to [facility] Change in bowel habits - Plan: Ambulatory referral to Gastroenterology Bloating - Plan: Ambulatory referral to Gastroenterology Indigestion - Plan: Ambulatory referral to Gastroenterology  Overweight (BMI 25.0-29.9) rec healthy diet/exercise  Vitamin D deficiency - Plan: VITAMIN D 25 Hydroxy (Vit-D Deficiency, Fractures)   HM Flu utd given today Tdap up to date  covid 2/2 moderna last 06/11/19 had 2/2  Hep B immune   Labs fasting 12/2019 rec increase exercise and healthy diet choices Referral [facility] Referral derm not seen resch 01/2020 Dr. Raliegh Ip  Pt not smoking Provider: Dr. Olivia Mackie McLean-Scocuzza-Internal Medicine

## 2019-10-31 ENCOUNTER — Telehealth: Payer: Self-pay | Admitting: Internal Medicine

## 2019-10-31 DIAGNOSIS — E559 Vitamin D deficiency, unspecified: Secondary | ICD-10-CM | POA: Insufficient documentation

## 2019-10-31 NOTE — Telephone Encounter (Signed)
Lm on vm that Dr. Aundra Dubin will take patient's wife as a new patient. Need wifes name, DOB and phone number.

## 2019-11-03 ENCOUNTER — Encounter: Payer: Self-pay | Admitting: Internal Medicine

## 2019-11-04 NOTE — Addendum Note (Signed)
Addended by: Orland Mustard on: 11/04/2019 01:40 PM   Modules accepted: Orders

## 2019-11-07 ENCOUNTER — Ambulatory Visit: Payer: BC Managed Care – PPO | Admitting: Surgery

## 2019-11-07 ENCOUNTER — Encounter: Payer: Self-pay | Admitting: Surgery

## 2019-11-07 ENCOUNTER — Other Ambulatory Visit: Payer: Self-pay

## 2019-11-07 VITALS — BP 130/90 | HR 67 | Temp 98.6°F | Ht 70.0 in | Wt 196.6 lb

## 2019-11-07 DIAGNOSIS — M795 Residual foreign body in soft tissue: Secondary | ICD-10-CM | POA: Diagnosis not present

## 2019-11-07 NOTE — Patient Instructions (Addendum)
Dr.Piscoya discussed with patient the different treatment options with patient at today's visit. 1) Allowing the inflammation to decrease within the next couple of weeks and excise the area.  2) Excise the area and allow the fluid collected to drain and see there's anything inside. If you notice the area worsening over the next couple of weeks, do not hesitate to give our office a call.  Wound Care, Adult Taking care of your wound properly can help to prevent pain, infection, and scarring. It can also help your wound to heal more quickly. How to care for your wound Wound care      Follow instructions from your health care provider about how to take care of your wound. Make sure you: ? Wash your hands with soap and water before you change the bandage (dressing). If soap and water are not available, use hand sanitizer. ? Change your dressing as told by your health care provider. ? Leave stitches (sutures), skin glue, or adhesive strips in place. These skin closures may need to stay in place for 2 weeks or longer. If adhesive strip edges start to loosen and curl up, you may trim the loose edges. Do not remove adhesive strips completely unless your health care provider tells you to do that.  Check your wound area every day for signs of infection. Check for: ? Redness, swelling, or pain. ? Fluid or blood. ? Warmth. ? Pus or a bad smell.  Ask your health care provider if you should clean the wound with mild soap and water. Doing this may include: ? Using a clean towel to pat the wound dry after cleaning it. Do not rub or scrub the wound. ? Applying a cream or ointment. Do this only as told by your health care provider. ? Covering the incision with a clean dressing.  Ask your health care provider when you can leave the wound uncovered.  Keep the dressing dry until your health care provider says it can be removed. Do not take baths, swim, use a hot tub, or do anything that would put the wound  underwater until your health care provider approves. Ask your health care provider if you can take showers. You may only be allowed to take sponge baths. Medicines   If you were prescribed an antibiotic medicine, cream, or ointment, take or use the antibiotic as told by your health care provider. Do not stop taking or using the antibiotic even if your condition improves.  Take over-the-counter and prescription medicines only as told by your health care provider. If you were prescribed pain medicine, take it 30 or more minutes before you do any wound care or as told by your health care provider. General instructions  Return to your normal activities as told by your health care provider. Ask your health care provider what activities are safe.  Do not scratch or pick at the wound.  Do not use any products that contain nicotine or tobacco, such as cigarettes and e-cigarettes. These may delay wound healing. If you need help quitting, ask your health care provider.  Keep all follow-up visits as told by your health care provider. This is important.  Eat a diet that includes protein, vitamin A, vitamin C, and other nutrient-rich foods to help the wound heal. ? Foods rich in protein include meat, dairy, beans, nuts, and other sources. ? Foods rich in vitamin A include carrots and dark green, leafy vegetables. ? Foods rich in vitamin C include citrus, tomatoes, and other fruits  and vegetables. ? Nutrient-rich foods have protein, carbohydrates, fat, vitamins, or minerals. Eat a variety of healthy foods including vegetables, fruits, and whole grains. Contact a health care provider if:  You received a tetanus shot and you have swelling, severe pain, redness, or bleeding at the injection site.  Your pain is not controlled with medicine.  You have redness, swelling, or pain around the wound.  You have fluid or blood coming from the wound.  Your wound feels warm to the touch.  You have pus or a bad  smell coming from the wound.  You have a fever or chills.  You are nauseous or you vomit.  You are dizzy. Get help right away if:  You have a red streak going away from your wound.  The edges of the wound open up and separate.  Your wound is bleeding, and the bleeding does not stop with gentle pressure.  You have a rash.  You faint.  You have trouble breathing. Summary  Always wash your hands with soap and water before changing your bandage (dressing).  To help with healing, eat foods that are rich in protein, vitamin A, vitamin C, and other nutrients.  Check your wound every day for signs of infection. Contact your health care provider if you suspect that your wound is infected. This information is not intended to replace advice given to you by your health care provider. Make sure you discuss any questions you have with your health care provider. Document Revised: 05/24/2018 Document Reviewed: 08/21/2015 Elsevier Patient Education  [place].

## 2019-11-07 NOTE — Progress Notes (Signed)
11/07/2019  Reason for Visit:  Retained foreign body  Referring Provider:  Orland Mustard, MD  History of Present Illness: Edwin Brady is a 39 y.o. male presenting for evaluation of a retained foreign body in the left forearm.  The patient reports that he was building his fence when he had a small accident and a piece of wood splintered and penetrated his left forearm.  He reports that he pulled what was sticking out, but he feels that there's a component that remains.  This area got infected and was draining some purulent fluid.  His PCP ordered Doxycycline and Mupirocin ointment and he finished the Doxy course last week.  He reports that the antibiotic has been working, as the area stopped draining, it's less tender, and less swollen/erythematous.  Reports the area that was draining is sealing up and there's been no drainage for a couple to few days.  Past Medical History: Past Medical History:  Diagnosis Date  . Chicken pox   . Linear scleroderma   . Raynaud disease   . Varicose vein    right calf     Past Surgical History: Past Surgical History:  Procedure Laterality Date  . NO PAST SURGERIES      Home Medications: Prior to Admission medications   Medication Sig Start Date End Date Taking? Authorizing Provider  amLODipine (NORVASC) 5 MG tablet Take 1 tablet (5 mg total) by mouth daily. D/c 2.5 10/26/19  Yes McLean-Scocuzza, Nino Glow, MD  doxycycline (VIBRA-TABS) 100 MG tablet Take 1 tablet (100 mg total) by mouth 2 (two) times daily. With food Patient not taking: Reported on 11/07/2019 10/26/19   McLean-Scocuzza, Nino Glow, MD  mupirocin ointment (BACTROBAN) 2 % Apply 1 application topically 2 (two) times daily. X 1 week Patient not taking: Reported on 11/07/2019 10/26/19   McLean-Scocuzza, Nino Glow, MD    Allergies: No Known Allergies  Social History:  reports that he has never smoked. He has never used smokeless tobacco. He reports current alcohol use. He reports that he  does not use drugs.   Family History: Family History  Problem Relation Age of Onset  . Asthma Brother        Childhood asthma  . Fabry's disease Brother   . Arthritis Maternal Grandmother   . Cancer Maternal Grandmother        Breast Cancer  . Hyperlipidemia Maternal Grandfather   . Heart disease Maternal Grandfather   . Hypertension Maternal Grandfather   . Arthritis Paternal Grandmother   . Arthritis Paternal Grandfather   . Hyperlipidemia Paternal Grandfather   . Clotting disorder Paternal Aunt        after covid d/o   . Celiac disease Other     Review of Systems: Review of Systems  Constitutional: Negative for chills and fever.  Respiratory: Negative for shortness of breath.   Cardiovascular: Negative for chest pain.  Gastrointestinal: Negative for nausea and vomiting.  Skin:       Left forearm retained foreign body with drainage.    Physical Exam BP 130/90   Pulse 67   Temp 98.6 F (37 C) (Oral)   Ht 5\' 10"  (1.778 m)   Wt 196 lb 9.6 oz (89.2 kg)   SpO2 98%   BMI 28.21 kg/m  CONSTITUTIONAL: No acute distress HEENT:  Normocephalic, atraumatic, extraocular motion intact. NECK: Trachea is midline, and there is no jugular venous distension.  RESPIRATORY:  Normal respiratory effort without pathologic use of accessory muscles. CARDIOVASCULAR: Regular rhythm and rate  MUSCULOSKELETAL:  Normal muscle strength and tone in all four extremities.  No peripheral edema or cyanosis. SKIN:  Left forearm has a small area about 3 x 1 cm in size with some firmness consistent with scar tissue, but possibly also foreign body.  There's no open wound as it's sealed already, with a small whitehead.  Mild tenderness to palpation, no erythema. NEUROLOGIC:  Motor and sensation is grossly normal.  Cranial nerves are grossly intact. PSYCH:  Alert and oriented to person, place and time. Affect is normal.  Laboratory Analysis: No results found for this or any previous visit (from the past 24  hour(s)).  Imaging: No results found.  Assessment and Plan: This is a 39 y.o. male with a previously infected retained foreign body, now healing.  --Discussed with the patient that at this point, there may be a bit left of infection and we have option of proceeding with I&D today, or allowing this area to continue to heal and the inflammation to subside and then proceed with an excision procedure.  He prefers to wait and do excision later.  Discussed with him that we'll schedule him for excision on 11/30/19.  Discussed return precautions with him if the area were to get infected again, to call us so we can proceed with I&D instead.  He understands.  Face-to-face time spent with the patient and care providers was 40 minutes, with more than 50% of the time spent counseling, educating, and coordinating care of the patient.     Melvyn Neth, [facility]

## 2019-11-30 ENCOUNTER — Ambulatory Visit: Payer: BC Managed Care – PPO | Admitting: Surgery

## 2019-11-30 ENCOUNTER — Encounter: Payer: Self-pay | Admitting: Surgery

## 2019-11-30 ENCOUNTER — Other Ambulatory Visit: Payer: Self-pay

## 2019-11-30 ENCOUNTER — Other Ambulatory Visit: Payer: Self-pay | Admitting: Surgery

## 2019-11-30 VITALS — BP 130/88 | HR 63 | Temp 98.3°F | Ht 70.0 in | Wt 197.0 lb

## 2019-11-30 DIAGNOSIS — Z1833 Retained wood fragments: Secondary | ICD-10-CM

## 2019-11-30 DIAGNOSIS — W458XXD Other foreign body or object entering through skin, subsequent encounter: Secondary | ICD-10-CM | POA: Diagnosis not present

## 2019-11-30 DIAGNOSIS — M795 Residual foreign body in soft tissue: Secondary | ICD-10-CM

## 2019-11-30 NOTE — Progress Notes (Signed)
  Procedure Date:  11/30/2019  Pre-operative Diagnosis:  Retained foreign body of left forearm  Post-operative Diagnosis:  Same  Procedure:  Excision of retained foreign body of left forearm  Surgeon:  Melvyn Neth, MD  Anesthesia:  2 ml 1% lidocaine with epi  Estimated Blood Loss:  5 ml  Specimens:  Retained foreign body with overlying skin  Complications:  None  Indications for Procedure:  This is a 39 y.o. male with diagnosis of a retained foreign body of the left forearm.  This was a splinter from a fence that punctured the skin, developed into a small infection, and has now healed.  The patient wishes to have this excised. The risks of bleeding, abscess or infection, injury to surrounding structures, and need for further procedures were all discussed with the patient and he was willing to proceed.  Description of Procedure: The patient was correctly identified at bedside.  The patient was placed supine.  Appropriate time-outs were performed.    The patient's left forearm was prepped and draped in usual sterile fashion.  Local anesthetic was infiltrated.  A 2.5 cm elliptical incision was made over the foreign body, and scalpel was used to dissect down the subcutaneous tissue.  Skin flap were created and the foreign body, with surrounding capsule/scar tissue, and overlying skin, were excised sharply, intact.  It was sent off to pathology.  The cavity was then irrigated and hemostasis was good.  The wound was then closed in two layers using 3-0 Vicryl and 4-0 Monocryl.  The incision was cleaned and sealed with DermaBond.  The patient tolerated the procedure well and all sharps were appropriately disposed of at the end of the case.  --Patient may apply ice packs over the wound for comfort. --May take Tylenol and Ibuprofen for pain. --Follow up 1 week for wound check.   Melvyn Neth, MD

## 2019-11-30 NOTE — Patient Instructions (Addendum)
We have removed a foreign body from your arm in our office today.  You have sutures under the skin that will dissolve and also dermabond (skin glue) on top of your skin which will come off on it's own in 10-14 days.  You may use 2 extra strength Tylenol or 600 mg of Ibuprofen every 6-8 hours as needed for discomfort. You may also use ice to the are several times a day for comfort.   You may shower, do not rub the area or put anything on the area. Do not submerge the area until fully healed.  Avoid Strenuous activities that will make you sweat during the next 48 hours to avoid the glue coming off prematurely. Avoid activities that will place pressure to this area of the body for 1-2 weeks to avoid re-injury to incision site.  Please see your follow-up appointment provided on 12/07/19. We will see you back in office to make sure this area is healed and to review the final pathology. If you have any questions or concerns prior to this appointment, call our office and speak with a nurse.  Call if you develop any redness, drainage, or increased pain in the area.

## 2019-12-01 ENCOUNTER — Telehealth: Payer: Self-pay

## 2019-12-01 NOTE — Telephone Encounter (Signed)
Patient instructed to hold strenuous activity for 2 weeks. Tried to respond to my chart message, unable to do so. Left message on VM for patient.

## 2019-12-07 ENCOUNTER — Ambulatory Visit (INDEPENDENT_AMBULATORY_CARE_PROVIDER_SITE_OTHER): Payer: BC Managed Care – PPO | Admitting: Surgery

## 2019-12-07 ENCOUNTER — Other Ambulatory Visit: Payer: Self-pay

## 2019-12-07 ENCOUNTER — Encounter: Payer: Self-pay | Admitting: Surgery

## 2019-12-07 VITALS — BP 138/83 | HR 75 | Temp 98.8°F | Resp 12 | Ht 70.0 in | Wt 198.0 lb

## 2019-12-07 DIAGNOSIS — M795 Residual foreign body in soft tissue: Secondary | ICD-10-CM

## 2019-12-07 DIAGNOSIS — Z09 Encounter for follow-up examination after completed treatment for conditions other than malignant neoplasm: Secondary | ICD-10-CM

## 2019-12-07 NOTE — Progress Notes (Signed)
12/07/2019  HPI: Lysle Yero is a 39 y.o. male s/p excision of foreign body of the left arm on 11/30/2019.  Presents today for follow-up.  He denies any issues.  Reports that initially he was having some mild discomfort but now is doing very well.  Vital signs: BP 138/83    Pulse 75    Temp 98.8 F (37.1 C) (Oral)    Resp 12    Ht 5\' 10"  (1.778 m)    Wt 198 lb (89.8 kg)    SpO2 95%    BMI 28.41 kg/m    Physical Exam: Constitutional: No acute distress Skin: Left forearm incision is healing well and is clean, dry, intact with no erythema or induration.  There is minimal discomfort to palpation.  Assessment/Plan: This is a 39 y.o. male s/p excision of foreign body of the left forearm.  -Reviewed with patient the benign pathology results showing potential wood splinter within the specimen. -Patient is healing well he can travel without restrictions at this point. -Follow-up as needed.   Melvyn Neth, [facility]

## 2019-12-07 NOTE — Patient Instructions (Signed)
Please call our office if you have questions or concerns.   

## 2020-01-02 ENCOUNTER — Encounter: Payer: Self-pay | Admitting: Internal Medicine

## 2020-01-02 LAB — HM COLONOSCOPY

## 2020-01-09 ENCOUNTER — Other Ambulatory Visit: Payer: BC Managed Care – PPO

## 2020-01-30 ENCOUNTER — Telehealth: Payer: Self-pay | Admitting: Internal Medicine

## 2020-01-30 ENCOUNTER — Encounter: Payer: Self-pay | Admitting: Internal Medicine

## 2020-01-30 ENCOUNTER — Ambulatory Visit: Payer: BC Managed Care – PPO | Admitting: Dermatology

## 2020-01-30 NOTE — Telephone Encounter (Signed)
Rejection Reason - Patient was No Show" [facility] said on Jan 30, 2020 4:18 PM

## 2020-05-10 ENCOUNTER — Other Ambulatory Visit: Payer: Self-pay

## 2020-05-10 ENCOUNTER — Encounter: Payer: Self-pay | Admitting: Internal Medicine

## 2020-05-10 ENCOUNTER — Ambulatory Visit: Payer: BC Managed Care – PPO | Admitting: Internal Medicine

## 2020-05-10 VITALS — BP 142/78 | HR 77 | Temp 97.9°F | Ht 70.0 in | Wt 208.0 lb

## 2020-05-10 DIAGNOSIS — R1031 Right lower quadrant pain: Secondary | ICD-10-CM

## 2020-05-10 DIAGNOSIS — I10 Essential (primary) hypertension: Secondary | ICD-10-CM

## 2020-05-10 LAB — CBC WITH DIFFERENTIAL/PLATELET
Basophils Absolute: 0 10*3/uL (ref 0.0–0.1)
Basophils Relative: 0.7 % (ref 0.0–3.0)
Eosinophils Absolute: 0.1 10*3/uL (ref 0.0–0.7)
Eosinophils Relative: 2 % (ref 0.0–5.0)
HCT: 42.4 % (ref 39.0–52.0)
Hemoglobin: 14.4 g/dL (ref 13.0–17.0)
Lymphocytes Relative: 30 % (ref 12.0–46.0)
Lymphs Abs: 2 10*3/uL (ref 0.7–4.0)
MCHC: 34 g/dL (ref 30.0–36.0)
MCV: 89.1 fl (ref 78.0–100.0)
Monocytes Absolute: 0.6 10*3/uL (ref 0.1–1.0)
Monocytes Relative: 9.7 % (ref 3.0–12.0)
Neutro Abs: 3.9 10*3/uL (ref 1.4–7.7)
Neutrophils Relative %: 57.6 % (ref 43.0–77.0)
Platelets: 216 10*3/uL (ref 150.0–400.0)
RBC: 4.76 Mil/uL (ref 4.22–5.81)
RDW: 13.1 % (ref 11.5–15.5)
WBC: 6.7 10*3/uL (ref 4.0–10.5)

## 2020-05-10 LAB — COMPREHENSIVE METABOLIC PANEL
ALT: 42 U/L (ref 0–53)
AST: 20 U/L (ref 0–37)
Albumin: 5.1 g/dL (ref 3.5–5.2)
Alkaline Phosphatase: 62 U/L (ref 39–117)
BUN: 13 mg/dL (ref 6–23)
CO2: 31 mEq/L (ref 19–32)
Calcium: 9.8 mg/dL (ref 8.4–10.5)
Chloride: 104 mEq/L (ref 96–112)
Creatinine, Ser: 0.96 mg/dL (ref 0.40–1.50)
GFR: 98.58 mL/min (ref >60.00)
Glucose, Bld: 101 mg/dL — ABNORMAL HIGH (ref 70–99)
Potassium: 4.9 mEq/L (ref 3.5–5.1)
Sodium: 142 mEq/L (ref 135–145)
Total Bilirubin: 0.5 mg/dL (ref 0.2–1.2)
Total Protein: 7.6 g/dL (ref 6.0–8.3)

## 2020-05-10 NOTE — Progress Notes (Signed)
Chief Complaint  Patient presents with  . Abdominal Pain    Lower R abdominal pressure but not painful x1 week. Had colonoscopy over the summer.    F/u  1. RLQ ab pressure  7 days and persistent worse with tight pants. He did pick up heavy kids 41 y.o and 16 y.o and this started after this. He is worried about appendicitis fh mom in 68s with appendicitis.  No nausea/vomiting/radiation of pain. No constipation Nothing tried    2. HTN on norvasc 5 mg qd taking this not checking BP b/c not symptoms    Review of Systems  Constitutional: Negative for weight loss.  HENT: Negative for hearing loss.   Eyes: Negative for blurred vision.  Respiratory: Negative for shortness of breath.   Cardiovascular: Negative for chest pain.  Gastrointestinal: Positive for abdominal pain.  Skin: Negative for rash.  Neurological: Negative for dizziness and headaches.   Past Medical History:  Diagnosis Date  . Chicken pox   . Linear scleroderma   . Raynaud disease   . Varicose vein    right calf   Past Surgical History:  Procedure Laterality Date  . NO PAST SURGERIES     Family History  Problem Relation Age of Onset  . Asthma Brother        Childhood asthma  . Fabry's disease Brother   . Arthritis Maternal Grandmother   . Cancer Maternal Grandmother        Breast Cancer  . Hyperlipidemia Maternal Grandfather   . Heart disease Maternal Grandfather   . Hypertension Maternal Grandfather   . Arthritis Paternal Grandmother   . Arthritis Paternal Grandfather   . Hyperlipidemia Paternal Grandfather   . Clotting disorder Paternal Aunt        after covid d/o   . Celiac disease Other    Social History   Socioeconomic History  . Marital status: Married    Spouse name: Aunchalee  . Number of children: 3  . Years of education: 67  . Highest education level: Not on file  Occupational History  . Occupation: Investment banker, operational    Comment: [employer]  Tobacco Use  . Smoking status: Never  Smoker  . Smokeless tobacco: Never Used  Substance and Sexual Activity  . Alcohol use: Yes    Comment: 2-3 daily  . Drug use: No  . Sexual activity: Yes    Partners: Female  Other Topics Concern  . Not on file  Social History Narrative   Tyron grew up "[place] over". His father was in the [facility]. He was born in [place]. He has lived in [place], [place], [place]. He graduated from the [employer] with a Bachelor in [place] with a minor in finance. 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 He currently lives in [place] with wife of 5 years and 3 children (2 boys, 1 girl). Tushar enjoys Estate agent. He enjoys spending time with the kids. He also enjoys Biomedical engineer. Whenever he has a chance he enjoys running.     Social Determinants of Health   Financial Resource Strain: Not on file  Food Insecurity: Not on file  Transportation Needs: Not on file  Physical Activity: Not on file  Stress: Not on file  Social Connections: Not on file  Intimate Partner Violence: Not on file   Current Meds  Medication Sig  . amLODipine (NORVASC) 5 MG tablet Take 1 tablet (5 mg total) by mouth daily.  D/c 2.5  . pantoprazole (PROTONIX) 40 MG tablet Take 40 mg by mouth daily.   No Known Allergies No results found for this or any previous visit (from the past 2160 hour(s)). Objective  Body mass index is 29.84 kg/m. Wt Readings from Last 3 Encounters:  05/10/20 208 lb (94.3 kg)  12/07/19 198 lb (89.8 kg)  11/30/19 197 lb (89.4 kg)   Temp Readings from Last 3 Encounters:  05/10/20 97.9 F (36.6 C)  12/07/19 98.8 F (37.1 C) (Oral)  11/30/19 98.3 F (36.8 C)   BP Readings from Last 3 Encounters:  05/10/20 (!) 152/110  12/07/19 138/83  11/30/19 130/88   Pulse Readings from Last 3 Encounters:  05/10/20 77  12/07/19 75  11/30/19 63    Physical Exam Vitals and nursing note reviewed.  Constitutional:      Appearance: Normal appearance. He is  well-developed and well-groomed.  HENT:     Head: Normocephalic and atraumatic.  Eyes:     Conjunctiva/sclera: Conjunctivae normal.     Pupils: Pupils are equal, round, and reactive to light.  Cardiovascular:     Rate and Rhythm: Normal rate and regular rhythm.     Heart sounds: Normal heart sounds.  Pulmonary:     Effort: Pulmonary effort is normal.     Breath sounds: Normal breath sounds.  Abdominal:     Tenderness: There is abdominal tenderness in the right lower quadrant. There is no right CVA tenderness or left CVA tenderness.  Skin:    General: Skin is warm and dry.  Neurological:     General: No focal deficit present.     Mental Status: He is alert and oriented to person, place, and time.     Gait: Gait normal.  Psychiatric:        Attention and Perception: Attention and perception normal.        Mood and Affect: Mood and affect normal.        Speech: Speech normal.        Behavior: Behavior normal. Behavior is cooperative.        Thought Content: Thought content normal.        Cognition and Memory: Cognition and memory normal.        Judgment: Judgment normal.     Assessment  Plan  RLQ abdominal pain r/o appendicitis, kidney stones, hernia - Plan: Comprehensive metabolic panel, CBC w/Diff, Comprehensive metabolic panel, CT Abdomen Pelvis W Contrast, Urinalysis, Routine w reflex microscopic,   Primary hypertension  norvasc 5 mg qd  Monitor BP and let me know in 2 weeks F/u as sch 06/2020  Provider: Dr. Olivia Mackie McLean-Scocuzza-Internal Medicine

## 2020-05-10 NOTE — Patient Instructions (Signed)
Monitor BP <130/<80  Check 2 hours after medications   Gastroesophageal Reflux Disease, Adult Gastroesophageal reflux (GER) happens when acid from the stomach flows up into the tube that connects the mouth and the stomach (esophagus). Normally, food travels down the esophagus and stays in the stomach to be digested. However, when a person has GER, food and stomach acid sometimes move back up into the esophagus. If this becomes a more serious problem, the person may be diagnosed with a disease called gastroesophageal reflux disease (GERD). GERD occurs when the reflux:  Happens often.  Causes frequent or severe symptoms.  Causes problems such as damage to the esophagus. When stomach acid comes in contact with the esophagus, the acid may cause inflammation in the esophagus. Over time, GERD may create small holes (ulcers) in the lining of the esophagus. What are the causes? This condition is caused by a problem with the muscle between the esophagus and the stomach (lower esophageal sphincter, or LES). Normally, the LES muscle closes after food passes through the esophagus to the stomach. When the LES is weakened or abnormal, it does not close properly, and that allows food and stomach acid to go back up into the esophagus. The LES can be weakened by certain dietary substances, medicines, and medical conditions, including:  Tobacco use.  Pregnancy.  Having a hiatal hernia.  Alcohol use.  Certain foods and beverages, such as coffee, chocolate, onions, and peppermint. What increases the risk? You are more likely to develop this condition if you:  Have an increased body weight.  Have a connective tissue disorder.  Take NSAIDs, such as ibuprofen. What are the signs or symptoms? Symptoms of this condition include:  Heartburn.  Difficult or painful swallowing and the feeling of having a lump in the throat.  A bitter taste in the mouth.  Bad breath and having a large amount of  saliva.  Having an upset or bloated stomach and belching.  Chest pain. Different conditions can cause chest pain. Make sure you see your health care provider if you experience chest pain.  Shortness of breath or wheezing.  Ongoing (chronic) cough or a nighttime cough.  Wearing away of tooth enamel.  Weight loss. How is this diagnosed? This condition may be diagnosed based on a medical history and a physical exam. To determine if you have mild or severe GERD, your health care provider may also monitor how you respond to treatment. You may also have tests, including:  A test to examine your stomach and esophagus with a small camera (endoscopy).  A test that measures the acidity level in your esophagus.  A test that measures how much pressure is on your esophagus.  A barium swallow or modified barium swallow test to show the shape, size, and functioning of your esophagus. How is this treated? Treatment for this condition may vary depending on how severe your symptoms are. Your health care provider may recommend:  Changes to your diet.  Medicine.  Surgery. The goal of treatment is to help relieve your symptoms and to prevent complications. Follow these instructions at home: Eating and drinking  Follow a diet as recommended by your health care provider. This may involve avoiding foods and drinks such as: ? Coffee and tea, with or without caffeine. ? Drinks that contain alcohol. ? Energy drinks and sports drinks. ? Carbonated drinks or sodas. ? Chocolate and cocoa. ? Peppermint and mint flavorings. ? Garlic and onions. ? Horseradish. ? Spicy and acidic foods, including peppers, chili  powder, curry powder, vinegar, hot sauces, and barbecue sauce. ? Citrus fruit juices and citrus fruits, such as oranges, lemons, and limes. ? Tomato-based foods, such as red sauce, chili, salsa, and pizza with red sauce. ? Fried and fatty foods, such as donuts, french fries, potato chips, and  high-fat dressings. ? High-fat meats, such as hot dogs and fatty cuts of red and white meats, such as rib eye steak, sausage, ham, and bacon. ? High-fat dairy items, such as whole milk, butter, and cream cheese.  Eat small, frequent meals instead of large meals.  Avoid drinking large amounts of liquid with your meals.  Avoid eating meals during the 2-3 hours before bedtime.  Avoid lying down right after you eat.  Do not exercise right after you eat.   Lifestyle  Do not use any products that contain nicotine or tobacco. These products include cigarettes, chewing tobacco, and vaping devices, such as e-cigarettes. If you need help quitting, ask your health care provider.  Try to reduce your stress by using methods such as yoga or meditation. If you need help reducing stress, ask your health care provider.  If you are overweight, reduce your weight to an amount that is healthy for you. Ask your health care provider for guidance about a safe weight loss goal.   General instructions  Pay attention to any changes in your symptoms.  Take over-the-counter and prescription medicines only as told by your health care provider. Do not take aspirin, ibuprofen, or other NSAIDs unless your health care provider told you to take these medicines.  Wear loose-fitting clothing. Do not wear anything tight around your waist that causes pressure on your abdomen.  Raise (elevate) the head of your bed about 6 inches (15 cm). You can use a wedge to do this.  Avoid bending over if this makes your symptoms worse.  Keep all follow-up visits. This is important. Contact a health care provider if:  You have: ? New symptoms. ? Unexplained weight loss. ? Difficulty swallowing or it hurts to swallow. ? Wheezing or a persistent cough. ? A hoarse voice.  Your symptoms do not improve with treatment. Get help right away if:  You have sudden pain in your arms, neck, jaw, teeth, or back.  You suddenly feel  sweaty, dizzy, or light-headed.  You have chest pain or shortness of breath.  You vomit and the vomit is green, yellow, or black, or it looks like blood or coffee grounds.  You faint.  You have stool that is red, bloody, or black.  You cannot swallow, drink, or eat. These symptoms may represent a serious problem that is an emergency. Do not wait to see if the symptoms will go away. Get medical help right away. Call your local emergency services (911 in the [place] Do not drive yourself to the hospital. Summary  Gastroesophageal reflux happens when acid from the stomach flows up into the esophagus. GERD is a disease in which the reflux happens often, causes frequent or severe symptoms, or causes problems such as damage to the esophagus.  Treatment for this condition may vary depending on how severe your symptoms are. Your health care provider may recommend diet and lifestyle changes, medicine, or surgery.  Contact a health care provider if you have new or worsening symptoms.  Take over-the-counter and prescription medicines only as told by your health care provider. Do not take aspirin, ibuprofen, or other NSAIDs unless your health care provider told you to do so.  Keep all  follow-up visits as told by your health care provider. This is important. This information is not intended to replace advice given to you by your health care provider. Make sure you discuss any questions you have with your health care provider. Document Revised: 08/15/2019 Document Reviewed: 08/15/2019 Elsevier Patient Education  2021 [place] for Gastroesophageal Reflux Disease, Adult When you have gastroesophageal reflux disease (GERD), the foods you eat and your eating habits are very important. Choosing the right foods can help ease the discomfort of GERD. Consider working with a dietitian to help you make healthy food choices. What are tips for following this plan? Reading food labels  Look  for foods that are low in saturated fat. Foods that have less than 5% of daily value (DV) of fat and 0 g of trans fats may help with your symptoms. Cooking  Cook foods using methods other than frying. This may include baking, steaming, grilling, or broiling. These are all methods that do not need a lot of fat for cooking.  To add flavor, try to use herbs that are low in spice and acidity. Meal planning  Choose healthy foods that are low in fat, such as fruits, vegetables, whole grains, low-fat dairy products, lean meats, fish, and poultry.  Eat frequent, small meals instead of three large meals each day. Eat your meals slowly, in a relaxed setting. Avoid bending over or lying down until 2-3 hours after eating.  Limit high-fat foods such as fatty meats or fried foods.  Limit your intake of fatty foods, such as oils, butter, and shortening.  Avoid the following as told by your health care provider: ? Foods that cause symptoms. These may be different for different people. Keep a food diary to keep track of foods that cause symptoms. ? Alcohol. ? Drinking large amounts of liquid with meals. ? Eating meals during the 2-3 hours before bed.   Lifestyle  Maintain a healthy weight. Ask your health care provider what weight is healthy for you. If you need to lose weight, work with your health care provider to do so safely.  Exercise for at least 30 minutes on 5 or more days each week, or as told by your health care provider.  Avoid wearing clothes that fit tightly around your waist and chest.  Do not use any products that contain nicotine or tobacco. These products include cigarettes, chewing tobacco, and vaping devices, such as e-cigarettes. If you need help quitting, ask your health care provider.  Sleep with the head of your bed raised. Use a wedge under the mattress or blocks under the bed frame to raise the head of the bed.  Chew sugar-free gum after mealtimes. What foods should I  eat? Eat a healthy, well-balanced diet of fruits, vegetables, whole grains, low-fat dairy products, lean meats, fish, and poultry. Each person is different. Foods that may trigger symptoms in one person may not trigger any symptoms in another person. Work with your health care provider to identify foods that are safe for you. The items listed above may not be a complete list of recommended foods and beverages. Contact a dietitian for more information.   What foods should I avoid? Limiting some of these foods may help manage the symptoms of GERD. Everyone is different. Consult a dietitian or your health care provider to help you identify the exact foods to avoid, if any. Fruits Any fruits prepared with added fat. Any fruits that cause symptoms. For some people this may  include citrus fruits, such as oranges, grapefruit, pineapple, and lemons. Vegetables Deep-fried vegetables. [place] fries. Any vegetables prepared with added fat. Any vegetables that cause symptoms. For some people, this may include tomatoes and tomato products, chili peppers, onions and garlic, and horseradish. Grains Pastries or quick breads with added fat. Meats and other proteins High-fat meats, such as fatty beef or pork, hot dogs, ribs, ham, sausage, salami, and bacon. Fried meat or protein, including fried fish and fried chicken. Nuts and nut butters, in large amounts. Dairy Whole milk and chocolate milk. Sour cream. Cream. Ice cream. Cream cheese. Milkshakes. Fats and oils Butter. Margarine. Shortening. Ghee. Beverages Coffee and tea, with or without caffeine. Carbonated beverages. Sodas. Energy drinks. Fruit juice made with acidic fruits, such as orange or grapefruit. Tomato juice. Alcoholic drinks. Sweets and desserts Chocolate and cocoa. Donuts. Seasonings and condiments Pepper. Peppermint and spearmint. Added salt. Any condiments, herbs, or seasonings that cause symptoms. For some people, this may include curry, hot  sauce, or vinegar-based salad dressings. The items listed above may not be a complete list of foods and beverages to avoid. Contact a dietitian for more information. Questions to ask your health care provider Diet and lifestyle changes are usually the first steps that are taken to manage symptoms of GERD. If diet and lifestyle changes do not improve your symptoms, talk with your health care provider about taking medicines. Where to find more information  International Foundation for Gastrointestinal Disorders: aboutgerd.org Summary  When you have gastroesophageal reflux disease (GERD), food and lifestyle choices may be very helpful in easing the discomfort of GERD.  Eat frequent, small meals instead of three large meals each day. Eat your meals slowly, in a relaxed setting. Avoid bending over or lying down until 2-3 hours after eating.  Limit high-fat foods such as fatty meats or fried foods. This information is not intended to replace advice given to you by your health care provider. Make sure you discuss any questions you have with your health care provider. Document Revised: 08/15/2019 Document Reviewed: 08/15/2019 Elsevier Patient Education  2021 [place].  Abdominal Pain, Adult Pain in the abdomen (abdominal pain) can be caused by many things. Often, abdominal pain is not serious and it gets better with no treatment or by being treated at home. However, sometimes abdominal pain is serious. Your health care provider will ask questions about your medical history and do a physical exam to try to determine the cause of your abdominal pain. Follow these instructions at home: Medicines  Take over-the-counter and prescription medicines only as told by your health care provider.  Do not take a laxative unless told by your health care provider. General instructions  Watch your condition for any changes.  Drink enough fluid to keep your urine pale yellow.  Keep all follow-up visits  as told by your health care provider. This is important.   Contact a health care provider if:  Your abdominal pain changes or gets worse.  You are not hungry or you lose weight without trying.  You are constipated or have diarrhea for more than 2-3 days.  You have pain when you urinate or have a bowel movement.  Your abdominal pain wakes you up at night.  Your pain gets worse with meals, after eating, or with certain foods.  You are vomiting and cannot keep anything down.  You have a fever.  You have blood in your urine. Get help right away if:  Your pain does not go away  as soon as your health care provider told you to expect.  You cannot stop vomiting.  Your pain is only in areas of the abdomen, such as the right side or the left lower portion of the abdomen. Pain on the right side could be caused by appendicitis.  You have bloody or black stools, or stools that look like tar.  You have severe pain, cramping, or bloating in your abdomen.  You have signs of dehydration, such as: ? Dark urine, very little urine, or no urine. ? Cracked lips. ? Dry mouth. ? Sunken eyes. ? Sleepiness. ? Weakness.  You have trouble breathing or chest pain. Summary  Often, abdominal pain is not serious and it gets better with no treatment or by being treated at home. However, sometimes abdominal pain is serious.  Watch your condition for any changes.  Take over-the-counter and prescription medicines only as told by your health care provider.  Contact a health care provider if your abdominal pain changes or gets worse.  Get help right away if you have severe pain, cramping, or bloating in your abdomen. This information is not intended to replace advice given to you by your health care provider. Make sure you discuss any questions you have with your health care provider. Document Revised: 03/25/2019 Document Reviewed: 06/14/2018 Elsevier Patient Education  [place].

## 2020-05-11 ENCOUNTER — Ambulatory Visit
Admission: RE | Admit: 2020-05-11 | Discharge: 2020-05-11 | Disposition: A | Discharge status: Home / Self Care | Payer: BC Managed Care – PPO | Source: Ambulatory Visit | Attending: Internal Medicine | Admitting: Internal Medicine

## 2020-05-11 DIAGNOSIS — R1031 Right lower quadrant pain: Secondary | ICD-10-CM | POA: Diagnosis present

## 2020-05-11 MED ORDER — IOHEXOL 300 MG/ML  SOLN
100.0000 mL | Freq: Once | INTRAMUSCULAR | Status: AC | PRN
  Administered 2020-05-11: 100 mL via INTRAVENOUS

## 2020-05-11 NOTE — Addendum Note (Signed)
Addended by: Leeanne Rio on: 05/11/2020 10:59 AM   Modules accepted: Orders

## 2020-05-12 LAB — URINALYSIS, ROUTINE W REFLEX MICROSCOPIC
Bilirubin, UA: NEGATIVE
Glucose, UA: NEGATIVE
Ketones, UA: NEGATIVE
Leukocytes,UA: NEGATIVE
Nitrite, UA: NEGATIVE
Protein,UA: NEGATIVE
RBC, UA: NEGATIVE
Specific Gravity, UA: 1.021 (ref 1.005–1.030)
Urobilinogen, Ur: 0.2 mg/dL (ref 0.2–1.0)
pH, UA: 5.5 (ref 5.0–7.5)

## 2020-05-14 ENCOUNTER — Encounter: Payer: Self-pay | Admitting: Internal Medicine

## 2020-05-14 NOTE — Telephone Encounter (Signed)
-----   Message from Delorise Jackson, MD sent at 05/11/2020  3:37 PM EDT ----- CT abdomen pelvis negative/normal

## 2020-05-14 NOTE — Telephone Encounter (Signed)
-----   Message from Delorise Jackson, MD sent at 05/14/2020  5:48 AM EDT ----- Liver kidneys normal Blood cts normal Urine normal  CT abdomen/pelvis normal  -pain could have been referred musculoskeletal pain from lifting his kids or heavy lifting/bending Is it still there?  If so consider PT, muscle relaxer as needed, tylenol--->please let me know ?

## 2020-06-29 ENCOUNTER — Encounter: Payer: Self-pay | Admitting: Internal Medicine

## 2020-06-29 ENCOUNTER — Ambulatory Visit (INDEPENDENT_AMBULATORY_CARE_PROVIDER_SITE_OTHER): Payer: BC Managed Care – PPO | Admitting: Internal Medicine

## 2020-06-29 ENCOUNTER — Other Ambulatory Visit: Payer: Self-pay

## 2020-06-29 VITALS — BP 108/76 | HR 68 | Temp 98.1°F | Ht 70.0 in | Wt 207.0 lb

## 2020-06-29 DIAGNOSIS — Z0000 Encounter for general adult medical examination without abnormal findings: Secondary | ICD-10-CM

## 2020-06-29 DIAGNOSIS — Z1329 Encounter for screening for other suspected endocrine disorder: Secondary | ICD-10-CM

## 2020-06-29 DIAGNOSIS — K2980 Duodenitis without bleeding: Secondary | ICD-10-CM

## 2020-06-29 DIAGNOSIS — E559 Vitamin D deficiency, unspecified: Secondary | ICD-10-CM

## 2020-06-29 DIAGNOSIS — I10 Essential (primary) hypertension: Secondary | ICD-10-CM | POA: Diagnosis not present

## 2020-06-29 DIAGNOSIS — Z23 Encounter for immunization: Secondary | ICD-10-CM | POA: Diagnosis not present

## 2020-06-29 DIAGNOSIS — K2950 Unspecified chronic gastritis without bleeding: Secondary | ICD-10-CM

## 2020-06-29 DIAGNOSIS — R7303 Prediabetes: Secondary | ICD-10-CM

## 2020-06-29 MED ORDER — PANTOPRAZOLE SODIUM 40 MG PO TBEC: 40.0000 mg | DELAYED_RELEASE_TABLET | Freq: Every day | ORAL | 3 refills | Status: DC

## 2020-06-29 MED ORDER — AMLODIPINE BESYLATE 5 MG PO TABS: 5.0000 mg | ORAL_TABLET | Freq: Every day | ORAL | 3 refills | Status: DC

## 2020-06-29 NOTE — Progress Notes (Signed)
Chief Complaint  Patient presents with  . Follow-up   Annual  1. htn controlled on norvasc 5 mg qd  2. Chronic inactive gastritis and peptic duodenitis on protonix 40 mg qd seen [facility]  3. tdap given today left arm    Review of Systems  Constitutional: Negative for weight loss.  HENT: Negative for hearing loss.   Eyes: Negative for blurred vision.  Respiratory: Negative for shortness of breath.   Cardiovascular: Negative for chest pain.  Gastrointestinal: Negative for abdominal pain.  Musculoskeletal: Negative for falls and joint pain.  Skin: Negative for rash.  Neurological: Negative for headaches.  Psychiatric/Behavioral: Negative for depression.   Past Medical History:  Diagnosis Date  . Chicken pox   . Hypertension   . Linear scleroderma   . Raynaud disease   . Varicose vein    right calf   Past Surgical History:  Procedure Laterality Date  . NO PAST SURGERIES     Family History  Problem Relation Age of Onset  . Asthma Brother        Childhood asthma  . Fabry's disease Brother   . Arthritis Maternal Grandmother   . Cancer Maternal Grandmother        Breast Cancer  . Hyperlipidemia Maternal Grandfather   . Heart disease Maternal Grandfather   . Hypertension Maternal Grandfather   . Arthritis Paternal Grandmother   . Arthritis Paternal Grandfather   . Hyperlipidemia Paternal Grandfather   . Clotting disorder Paternal Aunt        after covid d/o   . Celiac disease Other    Social History   Socioeconomic History  . Marital status: Married    Spouse name: Aunchalee  . Number of children: 3  . Years of education: 34  . Highest education level: Not on file  Occupational History  . Occupation: Investment banker, operational    Comment: [employer]  Tobacco Use  . Smoking status: Never Smoker  . Smokeless tobacco: Never Used  Substance and Sexual Activity  . Alcohol use: Yes    Comment: 2-3 daily  . Drug use: No  . Sexual activity: Yes    Partners: Female   Other Topics Concern  . Not on file  Social History Narrative   Edwin Brady [place] up "[place] over". His father was in the [employer] [facility] [employer]. He was born in [place]. He has lived in [place], [place], [place]. He graduated from the [employer] with a Bachelor in [place] with a minor in finance. 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 He currently lives in [place] with wife of 5 years and 3 children (2 boys, 1 girl). Makye enjoys Estate agent. He enjoys spending time with the kids. He also enjoys Biomedical engineer. Whenever he has a chance he enjoys running.     Social Determinants of Health   Financial Resource Strain: Not on file  Food Insecurity: Not on file  Transportation Needs: Not on file  Physical Activity: Not on file  Stress: Not on file  Social Connections: Not on file  Intimate Partner Violence: Not on file   Current Meds  Medication Sig  . [DISCONTINUED] amLODipine (NORVASC) 5 MG tablet Take 1 tablet (5 mg total) by mouth daily. D/c 2.5  . [DISCONTINUED] pantoprazole (PROTONIX) 40 MG tablet Take 40 mg by mouth daily.   No Known Allergies Recent Results (from the past 2160 hour(s))  CBC w/Diff     Status: None  Collection Time: 05/10/20 12:06 PM  Result Value Ref Range   WBC 6.7 4.0 - 10.5 K/uL   RBC 4.76 4.22 - 5.81 Mil/uL   Hemoglobin 14.4 13.0 - 17.0 g/dL   HCT 42.4 39.0 - 52.0 %   MCV 89.1 78.0 - 100.0 fl   MCHC 34.0 30.0 - 36.0 g/dL   RDW 13.1 11.5 - 15.5 %   Platelets 216.0 150.0 - 400.0 K/uL   Neutrophils Relative % 57.6 43.0 - 77.0 %   Lymphocytes Relative 30.0 12.0 - 46.0 %   Monocytes Relative 9.7 3.0 - 12.0 %   Eosinophils Relative 2.0 0.0 - 5.0 %   Basophils Relative 0.7 0.0 - 3.0 %   Neutro Abs 3.9 1.4 - 7.7 K/uL   Lymphs Abs 2.0 0.7 - 4.0 K/uL   Monocytes Absolute 0.6 0.1 - 1.0 K/uL   Eosinophils Absolute 0.1 0.0 - 0.7 K/uL   Basophils Absolute 0.0 0.0 - 0.1 K/uL  Comprehensive metabolic panel     Status: Abnormal   Collection  Time: 05/10/20 12:13 PM  Result Value Ref Range   Sodium 142 135 - 145 mEq/L   Potassium 4.9 3.5 - 5.1 mEq/L   Chloride 104 96 - 112 mEq/L   CO2 31 19 - 32 mEq/L   Glucose, Bld 101 (H) 70 - 99 mg/dL   BUN 13 6 - 23 mg/dL   Creatinine, Ser 0.96 0.40 - 1.50 mg/dL   Total Bilirubin 0.5 0.2 - 1.2 mg/dL   Alkaline Phosphatase 62 39 - 117 U/L   AST 20 0 - 37 U/L   ALT 42 0 - 53 U/L   Total Protein 7.6 6.0 - 8.3 g/dL   Albumin 5.1 3.5 - 5.2 g/dL   GFR 98.58 >60.00 mL/min    Comment: Calculated using the CKD-EPI Creatinine Equation (2021)   Calcium 9.8 8.4 - 10.5 mg/dL  Urinalysis, Routine w reflex microscopic     Status: None   Collection Time: 05/11/20 10:59 AM  Result Value Ref Range   Specific Gravity, UA 1.021 1.005 - 1.030   pH, UA 5.5 5.0 - 7.5   Color, UA Yellow Yellow   Appearance Ur Clear Clear   Leukocytes,UA Negative Negative   Protein,UA Negative Negative/Trace   Glucose, UA Negative Negative   Ketones, UA Negative Negative   RBC, UA Negative Negative   Bilirubin, UA Negative Negative   Urobilinogen, Ur 0.2 0.2 - 1.0 mg/dL   Nitrite, UA Negative Negative   Microscopic Examination Comment     Comment: Microscopic not indicated and not performed.   Objective  Body mass index is 29.7 kg/m. Wt Readings from Last 3 Encounters:  06/29/20 207 lb (93.9 kg)  05/10/20 208 lb (94.3 kg)  12/07/19 198 lb (89.8 kg)   Temp Readings from Last 3 Encounters:  06/29/20 98.1 F (36.7 C) (Oral)  05/10/20 97.9 F (36.6 C)  12/07/19 98.8 F (37.1 C) (Oral)   BP Readings from Last 3 Encounters:  06/29/20 108/76  05/10/20 (!) 142/78  12/07/19 138/83   Pulse Readings from Last 3 Encounters:  06/29/20 68  05/10/20 77  12/07/19 75    Physical Exam Vitals and nursing note reviewed.  Constitutional:      Appearance: Normal appearance. He is well-developed, well-groomed and overweight.  HENT:     Head: Normocephalic and atraumatic.  Cardiovascular:     Rate and Rhythm:  Normal rate and regular rhythm.     Heart sounds: Normal heart sounds. No murmur  heard.   Pulmonary:     Effort: Pulmonary effort is normal.     Breath sounds: Normal breath sounds.  Skin:    General: Skin is warm and dry.  Neurological:     General: No focal deficit present.     Mental Status: He is alert and oriented to person, place, and time. Mental status is at baseline.     Gait: Gait normal.  Psychiatric:        Attention and Perception: Attention and perception normal.        Mood and Affect: Mood and affect normal.        Speech: Speech normal.        Behavior: Behavior normal. Behavior is cooperative.        Thought Content: Thought content normal.        Cognition and Memory: Cognition and memory normal.        Judgment: Judgment normal.     Assessment  Plan  Annual physical exam Flu utd  Tdap up to date covid 3/3 consider 4th moderna Hep B immune  Labs fasting 06/2020 rec increase exerciseand healthy diet choices Referral [facility] 01/02/20 bx neg Referral derm not seen resch 01/2020 Dr. Raliegh Ip Pt not smoking rec healthy diet and exercise   Essential hypertension controlled- Plan: amLODipine (NORVASC) 5 MG tablet  Peptic duodenitis - Plan: pantoprazole (PROTONIX) 40 MG tablet Chronic gastritis without bleeding, unspecified gastritis type - Plan: pantoprazole (PROTONIX) 40 MG tablet    Provider: Dr. Olivia Mackie McLean-Scocuzza-[facility] Medicine

## 2020-06-29 NOTE — Patient Instructions (Addendum)
Compression socks knee highs coppertone [employer]  Call dermatology when able for total body skin check  [facility]  [facility] Dr. Ubaldo Glassing, Dr. Martin Majestic   Varicose Veins Varicose veins are veins that have become enlarged, bulged, and twisted. They most often appear in the legs. What are the causes? This condition is caused by damage to the valves in the vein. These valves help blood return to your heart. When they are damaged and they stop working properly, blood may flow backward and back up in the veins near the skin, causing the veins to get larger and appear twisted. The condition can result from any issue that causes blood to back up, like pregnancy, prolonged standing, or obesity. What increases the risk? This condition is more likely to develop in people who are:  On their feet a lot.  Pregnant.  Overweight. What are the signs or symptoms? Symptoms of this condition include:  Bulging, twisted, and bluish veins.  A feeling of heaviness. This may be worse at the end of the day.  Leg pain. This may be worse at the end of the day.  Swelling in the leg.  Changes in skin color over the veins. How is this diagnosed? This condition may be diagnosed based on your symptoms, a physical exam, and an ultrasound test. How is this treated? Treatment for this condition may involve:  Avoiding sitting or standing in one position for long periods of time.  Wearing compression stockings. These stockings help to prevent blood clots and reduce swelling in the legs.  Raising (elevating) the legs when resting.  Losing weight.  Exercising regularly. If you have persistent symptoms or want to improve the way your varicose veins look, you may choose to have a procedure to close the varicose veins off or to remove them. Treatments to close off the veins include:  Sclerotherapy. In this treatment, a solution is injected into a vein to close it off.  Laser treatment. In  this treatment, the vein is heated with a laser to close it off.  Radiofrequency vein ablation. In this treatment, an electrical current produced by radio waves is used to close off the vein. Treatments to remove the veins include:  Phlebectomy. In this treatment, the veins are removed through small incisions made over the veins.  Vein ligation and stripping. In this treatment, incisions are made over the veins. The veins are then removed after being tied (ligated) with stitches (sutures). Follow these instructions at home: Activity  Walk as much as possible. Walking increases blood flow. This helps blood return to the heart and takes pressure off your veins. It also increases your cardiovascular strength.  Follow your health care provider's instructions about exercising.  Do not stand or sit in one position for a long period of time.  Do not sit with your legs crossed.  Rest with your legs raised during the day. General instructions  Follow any diet instructions given to you by your health care provider.  Wear compression stockings as directed by your health care provider. Do not wear other kinds of tight clothing around your legs, pelvis, or waist.  Elevate your legs at night to above the level of your heart.  If you get a cut in the skin over the varicose vein and the vein bleeds: ? Lie down with your leg raised. ? Apply firm pressure to the cut with a clean cloth until the bleeding stops. ? Place a bandage (dressing) on the cut.   Contact a  health care provider if:  The skin around your varicose veins starts to break down.  You have pain, redness, tenderness, or hard swelling over a vein.  You are uncomfortable because of pain.  You get a cut in the skin over a varicose vein and it will not stop bleeding. Summary  Varicose veins are veins that have become enlarged, bulged, and twisted. They most often appear in the legs.  This condition is caused by damage to the  valves in the vein. These valves help blood return to your heart.  Treatment for this condition includes frequent movements, wearing compression stockings, losing weight, and exercising regularly. In some cases, procedures are done to close off or remove the veins.  Treatment for this condition may include wearing compression stockings, elevating the legs, losing weight, and engaging in regular activity. In some cases, procedures are done to close off or remove the veins. This information is not intended to replace advice given to you by your health care provider. Make sure you discuss any questions you have with your health care provider. Document Revised: 06/16/2019 Document Reviewed: 06/16/2019 Elsevier Patient Education  [place].

## 2020-07-09 ENCOUNTER — Other Ambulatory Visit: Payer: BC Managed Care – PPO

## 2020-07-17 ENCOUNTER — Other Ambulatory Visit: Payer: BC Managed Care – PPO

## 2020-08-07 ENCOUNTER — Encounter: Payer: Self-pay | Admitting: Internal Medicine

## 2020-08-08 ENCOUNTER — Other Ambulatory Visit (INDEPENDENT_AMBULATORY_CARE_PROVIDER_SITE_OTHER): Payer: BC Managed Care – PPO

## 2020-08-08 ENCOUNTER — Other Ambulatory Visit: Payer: Self-pay

## 2020-08-08 DIAGNOSIS — I10 Essential (primary) hypertension: Secondary | ICD-10-CM

## 2020-08-08 DIAGNOSIS — E559 Vitamin D deficiency, unspecified: Secondary | ICD-10-CM | POA: Diagnosis not present

## 2020-08-08 DIAGNOSIS — R7303 Prediabetes: Secondary | ICD-10-CM

## 2020-08-08 DIAGNOSIS — Z1329 Encounter for screening for other suspected endocrine disorder: Secondary | ICD-10-CM | POA: Diagnosis not present

## 2020-08-08 LAB — CBC WITH DIFFERENTIAL/PLATELET
Basophils Absolute: 0 10*3/uL (ref 0.0–0.1)
Basophils Relative: 0.6 % (ref 0.0–3.0)
Eosinophils Absolute: 0.2 10*3/uL (ref 0.0–0.7)
Eosinophils Relative: 3.3 % (ref 0.0–5.0)
HCT: 41.4 % (ref 39.0–52.0)
Hemoglobin: 14.3 g/dL (ref 13.0–17.0)
Lymphocytes Relative: 28.8 % (ref 12.0–46.0)
Lymphs Abs: 1.8 10*3/uL (ref 0.7–4.0)
MCHC: 34.4 g/dL (ref 30.0–36.0)
MCV: 88.6 fl (ref 78.0–100.0)
Monocytes Absolute: 0.7 10*3/uL (ref 0.1–1.0)
Monocytes Relative: 11.9 % (ref 3.0–12.0)
Neutro Abs: 3.4 10*3/uL (ref 1.4–7.7)
Neutrophils Relative %: 55.4 % (ref 43.0–77.0)
Platelets: 213 10*3/uL (ref 150.0–400.0)
RBC: 4.67 Mil/uL (ref 4.22–5.81)
RDW: 13.2 % (ref 11.5–15.5)
WBC: 6.1 10*3/uL (ref 4.0–10.5)

## 2020-08-08 LAB — BASIC METABOLIC PANEL
BUN: 22 mg/dL (ref 6–23)
CO2: 29 mEq/L (ref 19–32)
Calcium: 9.6 mg/dL (ref 8.4–10.5)
Chloride: 102 mEq/L (ref 96–112)
Creatinine, Ser: 0.97 mg/dL (ref 0.40–1.50)
GFR: 97.19 mL/min (ref >60.00)
Glucose, Bld: 117 mg/dL — ABNORMAL HIGH (ref 70–99)
Potassium: 4.3 mEq/L (ref 3.5–5.1)
Sodium: 138 mEq/L (ref 135–145)

## 2020-08-08 LAB — LIPID PANEL
Cholesterol: 213 mg/dL — ABNORMAL HIGH (ref 0–200)
HDL: 47.8 mg/dL (ref >39.00)
LDL Cholesterol: 137 mg/dL — ABNORMAL HIGH (ref 0–99)
NonHDL: 165.68
Total CHOL/HDL Ratio: 4
Triglycerides: 145 mg/dL (ref 0.0–149.0)
VLDL: 29 mg/dL (ref 0.0–40.0)

## 2020-08-08 LAB — HEMOGLOBIN A1C: Hgb A1c MFr Bld: 6.2 % (ref 4.6–6.5)

## 2020-08-08 LAB — VITAMIN D 25 HYDROXY (VIT D DEFICIENCY, FRACTURES): VITD: 34.14 ng/mL (ref 30.00–100.00)

## 2020-08-08 LAB — TSH: TSH: 2.34 u[IU]/mL (ref 0.35–4.50)

## 2020-08-08 NOTE — Telephone Encounter (Signed)
Please advise 

## 2020-08-17 NOTE — Progress Notes (Signed)
Left message to return call.  Will hold for Patient's appointment

## 2020-08-21 ENCOUNTER — Encounter: Payer: Self-pay | Admitting: Internal Medicine

## 2020-08-21 ENCOUNTER — Other Ambulatory Visit: Payer: Self-pay

## 2020-08-21 ENCOUNTER — Ambulatory Visit: Payer: BC Managed Care – PPO | Admitting: Internal Medicine

## 2020-08-21 VITALS — BP 126/84 | HR 75 | Temp 98.1°F | Ht 70.0 in | Wt 213.4 lb

## 2020-08-21 DIAGNOSIS — L821 Other seborrheic keratosis: Secondary | ICD-10-CM

## 2020-08-21 DIAGNOSIS — R7303 Prediabetes: Secondary | ICD-10-CM

## 2020-08-21 DIAGNOSIS — E785 Hyperlipidemia, unspecified: Secondary | ICD-10-CM

## 2020-08-21 DIAGNOSIS — L814 Other melanin hyperpigmentation: Secondary | ICD-10-CM | POA: Insufficient documentation

## 2020-08-21 DIAGNOSIS — D229 Melanocytic nevi, unspecified: Secondary | ICD-10-CM

## 2020-08-21 NOTE — Progress Notes (Signed)
Chief Complaint  Patient presents with   Skin Problem   Patient recently notice a freckle on the right nipple that he does not believe was there before noted in shower 1 month ago new. States that it is oddly shaped and colored. No swelling, redness, discharge, or change in size since noticing, no itching or irritation.   1 month ago hotel shower noticed lesion right chest appt derm 12/2020     Review of Systems  Constitutional:  Negative for weight loss.  HENT:  Negative for hearing loss.   Eyes:  Negative for blurred vision.  Respiratory:  Negative for shortness of breath.   Cardiovascular:  Negative for chest pain.  Skin:  Negative for rash.  Past Medical History:  Diagnosis Date   Chicken pox    Hypertension    Linear scleroderma    Raynaud disease    Varicose vein    right calf   Past Surgical History:  Procedure Laterality Date   NO PAST SURGERIES     Family History  Problem Relation Age of Onset   Asthma Brother        Childhood asthma   Fabry's disease Brother    Arthritis Maternal Grandmother    Cancer Maternal Grandmother        Breast Cancer   Hyperlipidemia Maternal Grandfather    Heart disease Maternal Grandfather    Hypertension Maternal Grandfather    Arthritis Paternal Grandmother    Arthritis Paternal Grandfather    Hyperlipidemia Paternal Grandfather    Clotting disorder Paternal Aunt        after covid d/o    Celiac disease Other    Heart disease Other    Social History   Socioeconomic History   Marital status: Married    Spouse name: Aunchalee   Number of children: 3   Years of education: 16   Highest education level: Not on file  Occupational History   Occupation: Investment banker, operational    Comment: [employer]. [employer]  Tobacco Use   Smoking status: Never   Smokeless tobacco: Never  Substance and Sexual Activity   Alcohol use: Yes    Comment: 2-3 daily   Drug use: No   Sexual activity: Yes    Partners: Female  Other Topics Concern   Not  on file  Social History Narrative   Cayetano [place] up "[place] over". His father was in the [employer]. He was born in [place]. He has lived in [place], [place], [place]. He graduated from the [employer] with a Bachelor in [place] with a minor in finance. 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 He currently lives in [place] with wife of 5 years and 3 children (2 boys, 1 girl). Festus enjoys Estate agent. He enjoys spending time with the kids. He also enjoys Biomedical engineer. Whenever he has a chance he enjoys running.     Social Determinants of Health   Financial Resource Strain: Not on file  Food Insecurity: Not on file  Transportation Needs: Not on file  Physical Activity: Not on file  Stress: Not on file  Social Connections: Not on file  Intimate Partner Violence: Not on file   Current Meds  Medication Sig   amLODipine (NORVASC) 5 MG tablet Take 1 tablet (5 mg total) by mouth daily. D/c 2.5   Multiple Vitamin (MULTIVITAMIN) tablet Take 1 tablet by mouth daily.   pantoprazole (PROTONIX) 40 MG tablet Take 1 tablet (40 mg  total) by mouth daily. 30 min. Before food as needed   No Known Allergies Recent Results (from the past 2160 hour(s))  Lipid panel     Status: Abnormal   Collection Time: 08/08/20  9:02 AM  Result Value Ref Range   Cholesterol 213 (H) 0 - 200 mg/dL    Comment: ATP III Classification       Desirable:  < 200 mg/dL               Borderline High:  200 - 239 mg/dL          High:  > = 240 mg/dL   Triglycerides 145.0 0.0 - 149.0 mg/dL    Comment: Normal:  <150 mg/dLBorderline High:  150 - 199 mg/dL   HDL 47.80 >39.00 mg/dL   VLDL 29.0 0.0 - 40.0 mg/dL   LDL Cholesterol 137 (H) 0 - 99 mg/dL   Total CHOL/HDL Ratio 4     Comment:                Men          Women1/2 Average Risk     3.4          3.3Average Risk          5.0          4.42X Average Risk          9.6          7.13X Average Risk          15.0          11.0                        NonHDL 165.68     Comment: NOTE:  Non-HDL goal should be 30 mg/dL higher than patient's LDL goal (i.e. LDL goal of < 70 mg/dL, would have non-HDL goal of < 100 mg/dL)  Basic Metabolic Panel (BMET)     Status: Abnormal   Collection Time: 08/08/20  9:02 AM  Result Value Ref Range   Sodium 138 135 - 145 mEq/L   Potassium 4.3 3.5 - 5.1 mEq/L   Chloride 102 96 - 112 mEq/L   CO2 29 19 - 32 mEq/L   Glucose, Bld 117 (H) 70 - 99 mg/dL   BUN 22 6 - 23 mg/dL   Creatinine, Ser 0.97 0.40 - 1.50 mg/dL   GFR 97.19 >60.00 mL/min    Comment: Calculated using the CKD-EPI Creatinine Equation (2021)   Calcium 9.6 8.4 - 10.5 mg/dL  CBC with Differential/Platelet     Status: None   Collection Time: 08/08/20  9:02 AM  Result Value Ref Range   WBC 6.1 4.0 - 10.5 K/uL   RBC 4.67 4.22 - 5.81 Mil/uL   Hemoglobin 14.3 13.0 - 17.0 g/dL   HCT 41.4 39.0 - 52.0 %   MCV 88.6 78.0 - 100.0 fl   MCHC 34.4 30.0 - 36.0 g/dL   RDW 13.2 11.5 - 15.5 %   Platelets 213.0 150.0 - 400.0 K/uL   Neutrophils Relative % 55.4 43.0 - 77.0 %   Lymphocytes Relative 28.8 12.0 - 46.0 %   Monocytes Relative 11.9 3.0 - 12.0 %   Eosinophils Relative 3.3 0.0 - 5.0 %   Basophils Relative 0.6 0.0 - 3.0 %   Neutro Abs 3.4 1.4 - 7.7 K/uL   Lymphs Abs 1.8 0.7 - 4.0 K/uL   Monocytes Absolute 0.7 0.1 - 1.0 K/uL  Eosinophils Absolute 0.2 0.0 - 0.7 K/uL   Basophils Absolute 0.0 0.0 - 0.1 K/uL  TSH     Status: None   Collection Time: 08/08/20  9:02 AM  Result Value Ref Range   TSH 2.34 0.35 - 4.50 uIU/mL  Vitamin D (25 hydroxy)     Status: None   Collection Time: 08/08/20  9:02 AM  Result Value Ref Range   VITD 34.14 30.00 - 100.00 ng/mL  Hemoglobin A1c     Status: None   Collection Time: 08/08/20  9:02 AM  Result Value Ref Range   Hgb A1c MFr Bld 6.2 4.6 - 6.5 %    Comment: Glycemic Control Guidelines for People with Diabetes:Non Diabetic:  <6%Goal of Therapy: <7%Additional Action Suggested:  >8%    Objective  Body mass index is  30.62 kg/m. Wt Readings from Last 3 Encounters:  08/21/20 213 lb 6.4 oz (96.8 kg)  06/29/20 207 lb (93.9 kg)  05/10/20 208 lb (94.3 kg)   Temp Readings from Last 3 Encounters:  08/21/20 98.1 F (36.7 C) (Oral)  06/29/20 98.1 F (36.7 C) (Oral)  05/10/20 97.9 F (36.6 C)   BP Readings from Last 3 Encounters:  08/21/20 126/84  06/29/20 108/76  05/10/20 (!) 142/78   Pulse Readings from Last 3 Encounters:  08/21/20 75  06/29/20 68  05/10/20 77    Physical Exam Vitals and nursing note reviewed.  Constitutional:      Appearance: Normal appearance. He is well-developed and well-groomed.  HENT:     Head: Normocephalic and atraumatic.  Eyes:     Conjunctiva/sclera: Conjunctivae normal.     Pupils: Pupils are equal, round, and reactive to light.  Cardiovascular:     Rate and Rhythm: Normal rate and regular rhythm.     Heart sounds: Normal heart sounds. No murmur heard. Pulmonary:     Effort: Pulmonary effort is normal.     Breath sounds: Normal breath sounds.  Skin:    General: Skin is warm and dry.  Neurological:     General: No focal deficit present.     Mental Status: He is alert and oriented to person, place, and time. Mental status is at baseline.     Gait: Gait normal.  Psychiatric:        Attention and Perception: Attention and perception normal.        Mood and Affect: Mood and affect normal.        Speech: Speech normal.        Behavior: Behavior normal. Behavior is cooperative.        Thought Content: Thought content normal.        Cognition and Memory: Cognition and memory normal.        Judgment: Judgment normal.    Assessment  Plan  Seborrheic keratosis right nipple Nevi/lentigenes F/u derm 12/2020    Hyperlipidemia, unspecified hyperlipidemia type Prediabetes Repeat cholesterol and consider statin at f/u   HM Flu utd  Tdap up to date  covid 3/3 consider 4th moderna Hep B immune    Labs fasting 06/2020 rec increase exercise and healthy diet  choices Referral [facility] 01/02/20 bx neg DERM APPT [facility] 12/2020  Pt not smoking rec healthy diet and exercise  WILL SOON BE MOVING TO [place]   Provider: Dr. Olivia Mackie McLean-Scocuzza-Internal Medicine

## 2020-08-21 NOTE — Patient Instructions (Addendum)
Please have dermatology fax the note to me [phone]   Prediabetes Prediabetes is when your blood sugar (blood glucose) level is higher than normal but not high enough for you to be diagnosed with type 2 diabetes. Having prediabetes puts you at risk for developing type 2 diabetes (type 2 diabetes mellitus). With certain lifestyle changes, you may be able to prevent or delay the onset of type 2 diabetes. This is important because type 2 diabetes can lead to serious complications, such as: Heart disease. Stroke. Blindness. Kidney disease. Depression. Poor circulation in the feet and legs. In severe cases, this could lead to surgical removal of a leg (amputation). What are the causes? The exact cause of prediabetes is not known. It may result from insulin resistance. Insulin resistance develops when cells in the body do not respond properly to insulin that the body makes. This can cause excess glucose to build up in the blood. High blood glucose (hyperglycemia) can develop. What increases the risk? The following factors may make you more likely to develop this condition: You have a family member with type 2 diabetes. You are older than 45 years. You had a temporary form of diabetes during a pregnancy (gestational diabetes). You had polycystic ovary syndrome (PCOS). You are overweight or obese. You are inactive (sedentary). You have a history of heart disease, including problems with cholesterol levels, high levels of blood fats, or high blood pressure. What are the signs or symptoms? You may have no symptoms. If you do have symptoms, they may include: Increased hunger. Increased thirst. Increased urination. Vision changes, such as blurry vision. Tiredness (fatigue). How is this diagnosed? This condition can be diagnosed with blood tests. Your blood glucose may be checked with one or more of the following tests: A fasting blood glucose (FBG) test. You will not be allowed to eat (you  will fast) for at least 8 hours before a blood sample is taken. An A1C blood test (hemoglobin A1C). This test provides information about blood glucose levels over the previous 2?3 months. An oral glucose tolerance test (OGTT). This test measures your blood glucose at two points in time: After fasting. This is your baseline level. Two hours after you drink a beverage that contains glucose. You may be diagnosed with prediabetes if: Your FBG is 100?125 mg/dL (5.6-6.9 mmol/L). Your A1C level is 5.7?6.4% (39-46 mmol/mol). Your OGTT result is 140?199 mg/dL (7.8-11 mmol/L). These blood tests may be repeated to confirm your diagnosis. How is this treated? Treatment may include dietary and lifestyle changes to help lower your blood glucose and prevent type 2 diabetes from developing. In some cases, medicinemay be prescribed to help lower the risk of type 2 diabetes. Follow these instructions at home: Nutrition  Follow a healthy meal plan. This includes eating lean proteins, whole grains, legumes, fresh fruits and vegetables, low-fat dairy products, and healthy fats. Follow instructions from your health care provider about eating or drinking restrictions. Meet with a dietitian to create a healthy eating plan that is right for you.  Lifestyle Do moderate-intensity exercise for at least 30 minutes a day on 5 or more days each week, or as told by your health care provider. A mix of activities may be best, such as: Brisk walking, swimming, biking, and weight lifting. Lose weight as told by your health care provider. Losing 5-7% of your body weight can reverse insulin resistance. Do not drink alcohol if: Your health care provider tells you not to drink. You are pregnant, may  be pregnant, or are planning to become pregnant. If you drink alcohol: Limit how much you use to: 0-1 drink a day for women. 0-2 drinks a day for men. Be aware of how much alcohol is in your drink. In the [place] one drink equals  one 12 oz bottle of beer (355 mL), one 5 oz glass of wine (148 mL), or one 1 oz glass of hard liquor (44 mL). General instructions Take over-the-counter and prescription medicines only as told by your health care provider. You may be prescribed medicines that help lower the risk of type 2 diabetes. Do not use any products that contain nicotine or tobacco, such as cigarettes, e-cigarettes, and chewing tobacco. If you need help quitting, ask your health care provider. Keep all follow-up visits. This is important. Where to find more information American Diabetes Association: www.diabetes.org Academy of Nutrition and Dietetics: www.eatright.org American Heart Association: www.heart.org Contact a health care provider if: You have any of these symptoms: Increased hunger. Increased urination. Increased thirst. Fatigue. Vision changes, such as blurry vision. Get help right away if you: Have shortness of breath. Feel confused. Vomit or feel like you may vomit. Summary Prediabetes is when your blood sugar (blood glucose)level is higher than normal but not high enough for you to be diagnosed with type 2 diabetes. Having prediabetes puts you at risk for developing type 2 diabetes (type 2 diabetes mellitus). Make lifestyle changes such as eating a healthy diet and exercising regularly to help prevent diabetes. Lose weight as told by your health care provider. This information is not intended to replace advice given to you by your health care provider. Make sure you discuss any questions you have with your healthcare provider. Document Revised: 05/05/2019 Document Reviewed: 05/05/2019 Elsevier Patient Education  2022 [place] refers to food and lifestyle choices that are based on the traditions of countries located on the [employer]. This way of eating has been shown to help prevent certain conditions and improve outcomes forpeople who have  chronic diseases, like kidney disease and heart disease. What are tips for following this plan? Lifestyle Cook and eat meals together with your family, when possible. Drink enough fluid to keep your urine clear or pale yellow. Be physically active every day. This includes: Aerobic exercise like running or swimming. Leisure activities like gardening, walking, or housework. Get 7-8 hours of sleep each night. If recommended by your health care provider, drink red wine in moderation. This means 1 glass a day for nonpregnant women and 2 glasses a day for men. A glass of wine equals 5 oz (150 mL). Reading food labels  Check the serving size of packaged foods. For foods such as rice and pasta, the serving size refers to the amount of cooked product, not dry. Check the total fat in packaged foods. Avoid foods that have saturated fat or trans fats. Check the ingredients list for added sugars, such as corn syrup.  Shopping At the grocery store, buy most of your food from the areas near the walls of the store. This includes: Fresh fruits and vegetables (produce). Grains, beans, nuts, and seeds. Some of these may be available in unpackaged forms or large amounts (in bulk). Fresh seafood. Poultry and eggs. Low-fat dairy products. Buy whole ingredients instead of prepackaged foods. Buy fresh fruits and vegetables in-season from local farmers markets. Buy frozen fruits and vegetables in resealable bags. If you do not have access to quality fresh seafood, buy  precooked frozen shrimp or canned fish, such as tuna, salmon, or sardines. Buy small amounts of raw or cooked vegetables, salads, or olives from the deli or salad bar at your store. Stock your pantry so you always have certain foods on hand, such as olive oil, canned tuna, canned tomatoes, rice, pasta, and beans. Cooking Cook foods with extra-virgin olive oil instead of using butter or other vegetable oils. Have meat as a side dish, and have  vegetables or grains as your main dish. This means having meat in small portions or adding small amounts of meat to foods like pasta or stew. Use beans or vegetables instead of meat in common dishes like chili or lasagna. Experiment with different cooking methods. Try roasting or broiling vegetables instead of steaming or sauteing them. Add frozen vegetables to soups, stews, pasta, or rice. Add nuts or seeds for added healthy fat at each meal. You can add these to yogurt, salads, or vegetable dishes. Marinate fish or vegetables using olive oil, lemon juice, garlic, and fresh herbs. Meal planning  Plan to eat 1 vegetarian meal one day each week. Try to work up to 2 vegetarian meals, if possible. Eat seafood 2 or more times a week. Have healthy snacks readily available, such as: Vegetable sticks with hummus. Greek yogurt. Fruit and nut trail mix. Eat balanced meals throughout the week. This includes: Fruit: 2-3 servings a day Vegetables: 4-5 servings a day Low-fat dairy: 2 servings a day Fish, poultry, or lean meat: 1 serving a day Beans and legumes: 2 or more servings a week Nuts and seeds: 1-2 servings a day Whole grains: 6-8 servings a day Extra-virgin olive oil: 3-4 servings a day Limit red meat and sweets to only a few servings a month  What are my food choices? Mediterranean diet Recommended Grains: Whole-grain pasta. Brown rice. Bulgar wheat. Polenta. Couscous. Whole-wheat bread. [place]. Vegetables: Artichokes. Beets. Broccoli. Cabbage. Carrots. Eggplant. Green beans. Chard. Kale. Spinach. Onions. Leeks. Peas. Squash. Tomatoes. Peppers. Radishes. Fruits: Apples. Apricots. Avocado. Berries. Bananas. Cherries. Dates. Figs. Grapes. Lemons. Melon. Oranges. Peaches. Plums. Pomegranate. Meats and other protein foods: Beans. Almonds. Sunflower seeds. Pine nuts. Peanuts. [place]. Salmon. Scallops. Shrimp. [place]. Tilapia. Clams. Oysters. Eggs. Dairy: Low-fat milk. Cheese. Greek  yogurt. Beverages: Water. Red wine. Herbal tea. Fats and oils: Extra virgin olive oil. Avocado oil. Grape seed oil. Sweets and desserts: [place] yogurt with honey. Baked apples. Poached pears. Trail mix. Seasoning and other foods: Basil. Cilantro. Coriander. Cumin. Mint. Parsley. Sage. Rosemary. Tarragon. Garlic. Oregano. Thyme. Pepper. Balsalmic vinegar. Tahini. Hummus. Tomato sauce. Olives. Mushrooms. Limit these Grains: Prepackaged pasta or rice dishes. Prepackaged cereal with added sugar. Vegetables: Deep fried potatoes (french fries). Fruits: Fruit canned in syrup. Meats and other protein foods: Beef. Pork. Lamb. Poultry with skin. Hot dogs. [place] Salines. Dairy: Ice cream. Sour cream. Whole milk. Beverages: Juice. Sugar-sweetened soft drinks. Beer. Liquor and spirits. Fats and oils: Butter. Canola oil. Vegetable oil. Beef fat (tallow). Lard. Sweets and desserts: Cookies. Cakes. Pies. Candy. Seasoning and other foods: Mayonnaise. Premade sauces and marinades. The items listed may not be a complete list. Talk with your dietitian aboutwhat dietary choices are right for you. Summary The Mediterranean diet includes both food and lifestyle choices. Eat a variety of fresh fruits and vegetables, beans, nuts, seeds, and whole grains. Limit the amount of red meat and sweets that you eat. Talk with your health care provider about whether it is safe for you to drink red wine in moderation. This means  1 glass a day for nonpregnant women and 2 glasses a day for men. A glass of wine equals 5 oz (150 mL). This information is not intended to replace advice given to you by your health care provider. Make sure you discuss any questions you have with your healthcare provider. Document Revised: 10/04/2015 Document Reviewed: 09/27/2015 Elsevier Patient Education  2020 [employer].     Why follow it? Research shows. Those who follow the Mediterranean diet have a reduced risk of heart disease  The diet is  associated with a reduced incidence of Parkinson's and Alzheimer's diseases People following the diet may have longer life expectancies and lower rates of chronic diseases  The Dietary Guidelines for Americans recommends the Mediterranean diet as an eating plan to promote health and prevent disease  What Is the Mediterranean Diet?  Healthy eating plan based on typical foods and recipes of Mediterranean-style cooking The diet is primarily a plant based diet; these foods should make up a majority of meals   Starches - Plant based foods should make up a majority of meals - They are an important sources of vitamins, minerals, energy, antioxidants, and fiber - Choose whole grains, foods high in fiber and minimally processed items  - Typical grain sources include wheat, oats, barley, corn, brown rice, bulgar, farro, millet, polenta, couscous  - Various types of beans include chickpeas, lentils, fava beans, black beans, white beans   Fruits  Veggies - Large quantities of antioxidant rich fruits & veggies; 6 or more servings  - Vegetables can be eaten raw or lightly drizzled with oil and cooked  - Vegetables common to the traditional Mediterranean Diet include: artichokes, arugula, beets, broccoli, brussel sprouts, cabbage, carrots, celery, collard greens, cucumbers, eggplant, kale, leeks, lemons, lettuce, mushrooms, okra, onions, peas, peppers, potatoes, pumpkin, radishes, rutabaga, shallots, spinach, sweet potatoes, turnips, zucchini - Fruits common to the Mediterranean Diet include: apples, apricots, avocados, cherries, clementines, dates, figs, grapefruits, grapes, melons, nectarines, oranges, peaches, pears, pomegranates, strawberries, tangerines  Fats - Replace butter and margarine with healthy oils, such as olive oil, canola oil, and tahini  - Limit nuts to no more than a handful a day  - Nuts include walnuts, almonds, pecans, pistachios, pine nuts  - Limit or avoid candied, honey roasted or  heavily salted nuts - Olives are central to the [employer] - can be eaten whole or used in a variety of dishes   Meats Protein - Limiting red meat: no more than a few times a month - When eating red meat: choose lean cuts and keep the portion to the size of deck of cards - Eggs: approx. 0 to 4 times a week  - Fish and lean poultry: at least 2 a week  - Healthy protein sources include, chicken, [place], lean beef, lamb - Increase intake of seafood such as tuna, salmon, trout, mackerel, shrimp, scallops - Avoid or limit high fat processed meats such as sausage and bacon  Dairy - Include moderate amounts of low fat dairy products  - Focus on healthy dairy such as fat free yogurt, skim milk, low or reduced fat cheese - Limit dairy products higher in fat such as whole or 2% milk, cheese, ice cream  Alcohol - Moderate amounts of red wine is ok  - No more than 5 oz daily for women (all ages) and men older than age 56  - No more than 10 oz of wine daily for men younger than 69  Other - Limit sweets  and other desserts  - Use herbs and spices instead of salt to flavor foods  - Herbs and spices common to the traditional Mediterranean Diet include: basil, bay leaves, chives, cloves, cumin, fennel, garlic, lavender, marjoram, mint, oregano, parsley, pepper, rosemary, sage, savory, sumac, tarragon, thyme   It's not just a diet, it's a lifestyle:  The Mediterranean diet includes lifestyle factors typical of those in the region  Foods, drinks and meals are best eaten with others and savored Daily physical activity is important for overall good health This could be strenuous exercise like running and aerobics This could also be more leisurely activities such as walking, housework, yard-work, or taking the stairs Moderation is the key; a balanced and healthy diet accommodates most foods and drinks Consider portion sizes and frequency of consumption of certain foods   Meal Ideas & Options:   Breakfast:  Whole wheat toast or whole wheat English muffins with peanut butter & hard boiled egg Steel cut oats topped with apples & cinnamon and skim milk  Fresh fruit: banana, strawberries, melon, berries, peaches  Smoothies: strawberries, bananas, greek yogurt, peanut butter Low fat greek yogurt with blueberries and granola  Egg white omelet with spinach and mushrooms Breakfast couscous: whole wheat couscous, apricots, skim milk, cranberries  Sandwiches:  Hummus and grilled vegetables (peppers, zucchini, squash) on whole wheat bread   Grilled chicken on whole wheat pita with lettuce, tomatoes, cucumbers or tzatziki  [place] salad on whole wheat bread: tuna salad made with greek yogurt, olives, red peppers, capers, green onions Garlic rosemary lamb pita: lamb sauted with garlic, rosemary, salt & pepper; add lettuce, cucumber, greek yogurt to pita - flavor with lemon juice and black pepper  Seafood:  Mediterranean grilled salmon, seasoned with garlic, basil, parsley, lemon juice and black pepper Shrimp, lemon, and spinach whole-grain pasta salad made with low fat greek yogurt  Seared scallops with lemon orzo  Seared tuna steaks seasoned salt, pepper, coriander topped with tomato mixture of olives, tomatoes, olive oil, minced garlic, parsley, green onions and cappers  Meats:  Herbed greek chicken salad with kalamata olives, cucumber, feta  Red bell peppers stuffed with spinach, bulgur, lean ground beef (or lentils) & topped with feta   Kebabs: skewers of chicken, tomatoes, onions, zucchini, squash  [place] burgers: made with red onions, mint, dill, lemon juice, feta cheese topped with roasted red peppers Vegetarian Cucumber salad: cucumbers, artichoke hearts, celery, red onion, feta cheese, tossed in olive oil & lemon juice  Hummus and whole grain pita points with a greek salad (lettuce, tomato, feta, olives, cucumbers, red onion) Lentil soup with celery, carrots made with vegetable broth,  garlic, salt and pepper  Tabouli salad: parsley, bulgur, mint, scallions, cucumbers, tomato, radishes, lemon juice, olive oil, salt and pepper.      High Cholesterol  High cholesterol is a condition in which the blood has high levels of a white, waxy substance similar to fat (cholesterol). The liver makes all the cholesterol that the body needs. The human body needs small amounts of cholesterol to help build cells. A person gets extra orexcess cholesterol from the food that he or she eats. The blood carries cholesterol from the liver to the rest of the body. If you have high cholesterol, deposits (plaques) may build up on the walls of your arteries. Arteries are the blood vessels that carry blood away from your heart. These plaques make the arteries narrowand stiff. Cholesterol plaques increase your risk for heart attack and stroke. Work [employer]  care provider to keep your cholesterol levels in a healthy range. What increases the risk? The following factors may make you more likely to develop this condition: Eating foods that are high in animal fat (saturated fat) or cholesterol. Being overweight. Not getting enough exercise. A family history of high cholesterol (familial hypercholesterolemia). Use of tobacco products. Having diabetes. What are the signs or symptoms? There are no symptoms of this condition. How is this diagnosed? This condition may be diagnosed based on the results of a blood test. If you are older than 41 years of age, your health care provider may check your cholesterol levels every 4-6 years. You may be checked more often if you have high cholesterol or other risk factors for heart disease. The blood test for cholesterol measures: "Bad" cholesterol, or LDL cholesterol. This is the main type of cholesterol that causes heart disease. The desired level is less than 100 mg/dL. "Good" cholesterol, or HDL cholesterol. HDL helps protect against heart disease by cleaning  the arteries and carrying the LDL to the liver for processing. The desired level for HDL is 60 mg/dL or higher. Triglycerides. These are fats that your body can store or burn for energy. The desired level is less than 150 mg/dL. Total cholesterol. This measures the total amount of cholesterol in your blood and includes LDL, HDL, and triglycerides. The desired level is less than 200 mg/dL. How is this treated? This condition may be treated with: Diet changes. You may be asked to eat foods that have more fiber and less saturated fats or added sugar. Lifestyle changes. These may include regular exercise, maintaining a healthy weight, and quitting use of tobacco products. Medicines. These are given when diet and lifestyle changes have not worked. You may be prescribed a statin medicine to help lower your cholesterol levels. Follow these instructions at home: Eating and drinking  Eat a healthy, balanced diet. This diet includes: Daily servings of a variety of fresh, frozen, or canned fruits and vegetables. Daily servings of whole grain foods that are rich in fiber. Foods that are low in saturated fats and trans fats. These include poultry and fish without skin, lean cuts of meat, and low-fat dairy products. A variety of fish, especially oily fish that contain omega-3 fatty acids. Aim to eat fish at least 2 times a week. Avoid foods and drinks that have added sugar. Use healthy cooking methods, such as roasting, grilling, broiling, baking, poaching, steaming, and stir-frying. Do not fry your food except for stir-frying.  Lifestyle  Get regular exercise. Aim to exercise for a total of 150 minutes a week. Increase your activity level by doing activities such as gardening, walking, and taking the stairs. Do not use any products that contain nicotine or tobacco, such as cigarettes, e-cigarettes, and chewing tobacco. If you need help quitting, ask your health care provider.  General instructions Take  over-the-counter and prescription medicines only as told by your health care provider. Keep all follow-up visits as told by your health care provider. This is important. Where to find more information American Heart Association: www.heart.org National Heart, Lung, and Blood Institute: https://wilson-eaton.com/ Contact a health care provider if: You have trouble achieving or maintaining a healthy diet or weight. You are starting an exercise program. You are unable to stop smoking. Get help right away if: You have chest pain. You have trouble breathing. You have any symptoms of a stroke. "BE FAST" is an easy way to remember the main warning signs of a stroke:  B - Balance. Signs are dizziness, sudden trouble walking, or loss of balance. E - Eyes. Signs are trouble seeing or a sudden change in vision. F - Face. Signs are sudden weakness or numbness of the face, or the face or eyelid drooping on one side. A - Arms. Signs are weakness or numbness in an arm. This happens suddenly and usually on one side of the body. S - Speech. Signs are sudden trouble speaking, slurred speech, or trouble understanding what people say. T - Time. Time to call emergency services. Write down what time symptoms started. You have other signs of a stroke, such as: A sudden, severe headache with no known cause. Nausea or vomiting. Seizure. These symptoms may represent a serious problem that is an emergency. Do not wait to see if the symptoms will go away. Get medical help right away. Call your local emergency services (911 in the [place] Do not drive yourself to the hospital. Summary Cholesterol plaques increase your risk for heart attack and stroke. Work with your health care provider to keep your cholesterol levels in a healthy range. Eat a healthy, balanced diet, get regular exercise, and maintain a healthy weight. Do not use any products that contain nicotine or tobacco, such as cigarettes, e-cigarettes, and chewing  tobacco. Get help right away if you have any symptoms of a stroke. This information is not intended to replace advice given to you by your health care provider. Make sure you discuss any questions you have with your healthcare provider. Document Revised: 01/03/2019 Document Reviewed: 01/03/2019 Elsevier Patient Education  [place].  Cholesterol Content in Foods Cholesterol is a waxy, fat-like substance that helps to carry fat in the blood. The body needs cholesterol in small amounts, but too much cholesterol can causedamage to the arteries and heart. Most people should eat less than 200 milligrams (mg) of cholesterol a day. Foods with cholesterol  Cholesterol is found in animal-based foods, such as meat, seafood, and dairy. Generally, low-fat dairy and lean meats have less cholesterol than full-fat dairy and fatty meats. The milligrams of cholesterol per serving (mg per serving) of common cholesterol-containing foods are listed below. Meat and other proteins Egg -- one large whole egg has 186 mg. Veal shank -- 4 oz has 141 mg. Lean ground [place] (93% lean) -- 4 oz has 118 mg. Fat-trimmed lamb loin -- 4 oz has 106 mg. Lean ground beef (90% lean) -- 4 oz has 100 mg. Lobster -- 3.5 oz has 90 mg. Pork loin chops -- 4 oz has 86 mg. Canned salmon -- 3.5 oz has 83 mg. Fat-trimmed beef top loin -- 4 oz has 78 mg. Frankfurter -- 1 frank (3.5 oz) has 77 mg. Crab -- 3.5 oz has 71 mg. Roasted chicken without skin, white meat -- 4 oz has 66 mg. Light bologna -- 2 oz has 45 mg. Deli-cut [place] -- 2 oz has 31 mg. Canned tuna -- 3.5 oz has 31 mg. Berniece Salines -- 1 oz has 29 mg. Oysters and mussels (raw) -- 3.5 oz has 25 mg. Mackerel -- 1 oz has 22 mg. Trout -- 1 oz has 20 mg. Pork sausage -- 1 link (1 oz) has 17 mg. Salmon -- 1 oz has 16 mg. Tilapia -- 1 oz has 14 mg. Dairy Soft-serve ice cream --  cup (4 oz) has 103 mg. Whole-milk yogurt -- 1 cup (8 oz) has 29 mg. Cheddar cheese -- 1 oz  has 28 mg. American cheese -- 1 oz has  28 mg. Whole milk -- 1 cup (8 oz) has 23 mg. 2% milk -- 1 cup (8 oz) has 18 mg. Cream cheese -- 1 tablespoon (Tbsp) has 15 mg. Cottage cheese --  cup (4 oz) has 14 mg. Low-fat (1%) milk -- 1 cup (8 oz) has 10 mg. Sour cream -- 1 Tbsp has 8.5 mg. Low-fat yogurt -- 1 cup (8 oz) has 8 mg. Nonfat Greek yogurt -- 1 cup (8 oz) has 7 mg. Half-and-half cream -- 1 Tbsp has 5 mg. Fats and oils Cod liver oil -- 1 tablespoon (Tbsp) has 82 mg. Butter -- 1 Tbsp has 15 mg. Lard -- 1 Tbsp has 14 mg. Bacon grease -- 1 Tbsp has 14 mg. Mayonnaise -- 1 Tbsp has 5-10 mg. Margarine -- 1 Tbsp has 3-10 mg. Exact amounts of cholesterol in these foods may vary depending on specificingredients and brands. Foods without cholesterol Most plant-based foods do not have cholesterol unless you combine them with a food that has cholesterol. Foods without cholesterol include: Grains and cereals. Vegetables. Fruits. Vegetable oils, such as olive, canola, and sunflower oil. Legumes, such as peas, beans, and lentils. Nuts and seeds. Egg whites. Summary The body needs cholesterol in small amounts, but too much cholesterol can cause damage to the arteries and heart. Most people should eat less than 200 milligrams (mg) of cholesterol a day. This information is not intended to replace advice given to you by your health care provider. Make sure you discuss any questions you have with your healthcare provider. Document Revised: 05/17/2019 Document Reviewed: 06/27/2019 Elsevier Patient Education  [facility]   A seborrheic keratosis is a common, noncancerous (benign) skin growth. These growths are velvety, waxy, rough, tan, brown, or black spots that appear on the skin. These skin growths can be flat or raised, andscaly. What are the causes? The cause of this condition is not known. What increases the risk? You are more likely to  develop this condition if you: Have a family history of seborrheic keratosis. Are 50 or older. Are pregnant. Have had estrogen replacement therapy. What are the signs or symptoms? Symptoms of this condition include growths on the face, chest, shoulders, back, or other areas. These growths: Are usually painless, but may become irritated and itchy. Can be yellow, brown, black, or other colors. Are slightly raised or have a flat surface. Are sometimes rough or wart-like in texture. Are often velvety or waxy on the surface. Are round or oval-shaped. Often occur in groups, but may occur as a single growth. How is this diagnosed? This condition is diagnosed with a medical history and physical exam. A sample of the growth may be tested (skin biopsy). You may need to see a skin specialist (dermatologist). How is this treated? Treatment is not usually needed for this condition, unless the growths are irritated or bleed often. You may also choose to have the growths removed if you do not like their appearance. Most commonly, these growths are treated with a procedure in which liquid nitrogen is applied to "freeze" off the growth (cryosurgery). They may also be burned off with electricity (electrocautery) or removed by scraping (curettage). Follow these instructions at home: Watch your growth for any changes. Keep all follow-up visits as told by your health care provider. This is important. Do not scratch or pick at the growth or growths. This can cause them to become irritated or infected. Contact a health care provider if: You suddenly  have many new growths. Your growth bleeds, itches, or hurts. Your growth suddenly becomes larger or changes color. Summary A seborrheic keratosis is a common, noncancerous (benign) skin growth. Treatment is not usually needed for this condition, unless the growths are irritated or bleed often. Watch your growth for any changes. Contact a health care provider  if you suddenly have many new growths or your growth suddenly becomes larger or changes color. Keep all follow-up visits as told by your health care provider. This is important. This information is not intended to replace advice given to you by your health care provider. Make sure you discuss any questions you have with your healthcare provider. Document Revised: 06/18/2017 Document Reviewed: 06/18/2017 Elsevier Patient Education  2022 [employer].  Mole A mole is a colored (pigmented) growth on the skin. Moles are very common. They are usually harmless, butsome moles can become cancerous over time. What are the causes? Moles are caused when pigmented skin cells grow together in clusters instead of spreading out in the skin as they normally do. The reason why the skin cellsgrow together in clusters is not known. What increases the risk? You are more likely to develop a mole if you: Have family members who have moles. Are white. Have blond hair. Are often outdoors and exposed to the sun. Received phototherapy when you were a newborn baby. Are male. What are the signs or symptoms? A mole may be: [place] or black. Flat or raised. Smooth or wrinkled. How is this diagnosed? A mole is diagnosed with a skin exam. If your health care provider thinks a mole may be cancerous, all or part of the mole will be removed for testing (biopsy). How is this treated? Most moles are noncancerous (benign) and do not require treatment. If a mole is found to be cancerous, it will be removed. You may also choose to have a mole removed if it is causing pain or ifyou do not like the way it looks. Follow these instructions at home: General instructions  Every month, look for new moles and check your existing moles for changes. This is important because a change in a mole can mean that the mole has become cancerous. ABCDE changes in a mole indicate that you should be evaluated by your health care provider. ABCDE  stands for: Asymmetry. This means the mole has an irregular shape. It is not round or oval. Border. This means the mole has an irregular or bumpy border. Color. This means the mole has multiple colors in it, including brown, black, blue, red, or tan. Note that it is normal for moles to get darker when a woman is pregnant or takes birth control pills. Diameter. This means the mole is more than 0.2 inches (6 mm) across. Evolving. This refers to any unusual changes or symptoms in the mole, such as pain, itching, stinging, sensitivity, or bleeding. If you have a large number of moles, see a skin doctor (dermatologist) at least one time every year for a full-body skin check.  Lifestyle  When you are outdoors, wear sunscreen with SPF 30 (sun protection factor 30) or higher. Use an adequate amount of sunscreen to cover exposed areas of skin. Put it on 30 minutes before you go out. Reapply it every 2 hours or anytime you come out of the water. When you are out in the sun, wear a broad-brimmed hat and clothing that covers your arms and legs. Wear wraparound sunglasses.  Contact a health care provider if: The size,  shape, borders, or color of your mole changes. Your mole, or the skin near the mole, becomes painful, sore, red, or swollen. Your mole: Develops more than one color. Itches or bleeds. Becomes scaly, sheds skin, or oozes fluid. Becomes flat or develops raised areas. Becomes hard or soft. You develop a new mole. Summary A mole is a colored (pigmented) growth on the skin. Moles are very common. They are usually harmless, but some moles can become cancerous over time. Every month, look for new moles and check your existing moles for changes. This is important because a change in a mole can mean that the mole has become cancerous. If you have a large number of moles, see a skin doctor (dermatologist) at least one time every year for a full-body skin check. When you are outdoors, wear  sunscreen with SPF 30 (sun protection factor 30) or higher. Reapply it every 2 hours or anytime you come out of the water. Contact a health care provider if you notice changes in a mole or if you develop a new mole. This information is not intended to replace advice given to you by your health care provider. Make sure you discuss any questions you have with your healthcare provider. Document Revised: 09/09/2018 Document Reviewed: 06/30/2017 Elsevier Patient Education  [place].

## 2020-08-21 NOTE — Progress Notes (Signed)
Patient recently notice a freckle on the right nipple that he does not believe was there before. States that it is oddly shaped and colored.  No swelling, redness, discharge, or change in size since noticing, no itching or irritation.   Appointment with dermatology in November but wanted this checked sooner.

## 2021-01-03 ENCOUNTER — Other Ambulatory Visit: Payer: Self-pay

## 2021-01-03 DIAGNOSIS — Z1389 Encounter for screening for other disorder: Secondary | ICD-10-CM

## 2021-01-03 DIAGNOSIS — E559 Vitamin D deficiency, unspecified: Secondary | ICD-10-CM

## 2021-01-03 DIAGNOSIS — I10 Essential (primary) hypertension: Secondary | ICD-10-CM

## 2021-01-03 DIAGNOSIS — R7303 Prediabetes: Secondary | ICD-10-CM

## 2021-01-03 DIAGNOSIS — Z1329 Encounter for screening for other suspected endocrine disorder: Secondary | ICD-10-CM

## 2021-01-03 DIAGNOSIS — E785 Hyperlipidemia, unspecified: Secondary | ICD-10-CM

## 2021-01-07 ENCOUNTER — Telehealth: Payer: Self-pay | Admitting: Internal Medicine

## 2021-01-07 NOTE — Addendum Note (Signed)
Addended by: Orland Mustard on: 01/07/2021 01:01 PM   Modules accepted: Orders

## 2021-01-07 NOTE — Telephone Encounter (Signed)
With labs upcoming want all labs 01/03/21 and 01/07/21  Thank you labs visit 01/25/21 I believe

## 2021-01-07 NOTE — Telephone Encounter (Signed)
Made note on lab appt

## 2021-01-25 ENCOUNTER — Other Ambulatory Visit (INDEPENDENT_AMBULATORY_CARE_PROVIDER_SITE_OTHER): Payer: BC Managed Care – PPO

## 2021-01-25 ENCOUNTER — Other Ambulatory Visit: Payer: Self-pay

## 2021-01-25 DIAGNOSIS — E785 Hyperlipidemia, unspecified: Secondary | ICD-10-CM

## 2021-01-25 DIAGNOSIS — I10 Essential (primary) hypertension: Secondary | ICD-10-CM | POA: Diagnosis not present

## 2021-01-25 DIAGNOSIS — R7303 Prediabetes: Secondary | ICD-10-CM | POA: Diagnosis not present

## 2021-01-25 DIAGNOSIS — E559 Vitamin D deficiency, unspecified: Secondary | ICD-10-CM

## 2021-01-25 DIAGNOSIS — Z1329 Encounter for screening for other suspected endocrine disorder: Secondary | ICD-10-CM

## 2021-01-25 DIAGNOSIS — Z1389 Encounter for screening for other disorder: Secondary | ICD-10-CM

## 2021-01-25 LAB — CBC WITH DIFFERENTIAL/PLATELET
Basophils Absolute: 0 10*3/uL (ref 0.0–0.1)
Basophils Relative: 0.6 % (ref 0.0–3.0)
Eosinophils Absolute: 0.2 10*3/uL (ref 0.0–0.7)
Eosinophils Relative: 3.8 % (ref 0.0–5.0)
HCT: 42.8 % (ref 39.0–52.0)
Hemoglobin: 14.1 g/dL (ref 13.0–17.0)
Lymphocytes Relative: 33.6 % (ref 12.0–46.0)
Lymphs Abs: 1.8 10*3/uL (ref 0.7–4.0)
MCHC: 32.9 g/dL (ref 30.0–36.0)
MCV: 90.3 fl (ref 78.0–100.0)
Monocytes Absolute: 0.5 10*3/uL (ref 0.1–1.0)
Monocytes Relative: 9.7 % (ref 3.0–12.0)
Neutro Abs: 2.8 10*3/uL (ref 1.4–7.7)
Neutrophils Relative %: 52.3 % (ref 43.0–77.0)
Platelets: 201 10*3/uL (ref 150.0–400.0)
RBC: 4.74 Mil/uL (ref 4.22–5.81)
RDW: 13.2 % (ref 11.5–15.5)
WBC: 5.4 10*3/uL (ref 4.0–10.5)

## 2021-01-25 LAB — TSH: TSH: 2.44 u[IU]/mL (ref 0.35–5.50)

## 2021-01-25 LAB — LIPID PANEL
Cholesterol: 184 mg/dL (ref 0–200)
HDL: 43.4 mg/dL (ref >39.00)
LDL Cholesterol: 127 mg/dL — ABNORMAL HIGH (ref 0–99)
NonHDL: 140.99
Total CHOL/HDL Ratio: 4
Triglycerides: 71 mg/dL (ref 0.0–149.0)
VLDL: 14.2 mg/dL (ref 0.0–40.0)

## 2021-01-25 LAB — VITAMIN D 25 HYDROXY (VIT D DEFICIENCY, FRACTURES): VITD: 27.45 ng/mL — ABNORMAL LOW (ref 30.00–100.00)

## 2021-01-25 LAB — COMPREHENSIVE METABOLIC PANEL
ALT: 45 U/L (ref 0–53)
AST: 24 U/L (ref 0–37)
Albumin: 4.6 g/dL (ref 3.5–5.2)
Alkaline Phosphatase: 61 U/L (ref 39–117)
BUN: 18 mg/dL (ref 6–23)
CO2: 29 mEq/L (ref 19–32)
Calcium: 9.6 mg/dL (ref 8.4–10.5)
Chloride: 104 mEq/L (ref 96–112)
Creatinine, Ser: 1.01 mg/dL (ref 0.40–1.50)
GFR: 92.29 mL/min (ref >60.00)
Glucose, Bld: 94 mg/dL (ref 70–99)
Potassium: 4.3 mEq/L (ref 3.5–5.1)
Sodium: 140 mEq/L (ref 135–145)
Total Bilirubin: 0.5 mg/dL (ref 0.2–1.2)
Total Protein: 7.1 g/dL (ref 6.0–8.3)

## 2021-01-25 LAB — HEMOGLOBIN A1C: Hgb A1c MFr Bld: 6.2 % (ref 4.6–6.5)

## 2021-01-26 LAB — URINALYSIS, ROUTINE W REFLEX MICROSCOPIC
Bilirubin Urine: NEGATIVE
Glucose, UA: NEGATIVE
Hgb urine dipstick: NEGATIVE
Ketones, ur: NEGATIVE
Leukocytes,Ua: NEGATIVE
Nitrite: NEGATIVE
Protein, ur: NEGATIVE
Specific Gravity, Urine: 1.018 (ref 1.001–1.035)
pH: 5.5 (ref 5.0–8.0)

## 2021-01-29 ENCOUNTER — Ambulatory Visit: Payer: BC Managed Care – PPO | Admitting: Internal Medicine

## 2021-01-29 ENCOUNTER — Encounter: Payer: Self-pay | Admitting: Internal Medicine

## 2021-01-29 ENCOUNTER — Other Ambulatory Visit: Payer: Self-pay

## 2021-01-29 VITALS — BP 126/80 | HR 71 | Temp 97.0°F | Ht 70.0 in | Wt 213.8 lb

## 2021-01-29 DIAGNOSIS — K2950 Unspecified chronic gastritis without bleeding: Secondary | ICD-10-CM

## 2021-01-29 DIAGNOSIS — R5383 Other fatigue: Secondary | ICD-10-CM

## 2021-01-29 DIAGNOSIS — K2980 Duodenitis without bleeding: Secondary | ICD-10-CM | POA: Diagnosis not present

## 2021-01-29 DIAGNOSIS — E611 Iron deficiency: Secondary | ICD-10-CM

## 2021-01-29 DIAGNOSIS — Z1283 Encounter for screening for malignant neoplasm of skin: Secondary | ICD-10-CM

## 2021-01-29 DIAGNOSIS — E785 Hyperlipidemia, unspecified: Secondary | ICD-10-CM | POA: Diagnosis not present

## 2021-01-29 DIAGNOSIS — I10 Essential (primary) hypertension: Secondary | ICD-10-CM

## 2021-01-29 DIAGNOSIS — E559 Vitamin D deficiency, unspecified: Secondary | ICD-10-CM

## 2021-01-29 DIAGNOSIS — R7303 Prediabetes: Secondary | ICD-10-CM

## 2021-01-29 MED ORDER — PANTOPRAZOLE SODIUM 40 MG PO TBEC: 40.0000 mg | DELAYED_RELEASE_TABLET | Freq: Every day | ORAL | 3 refills | Status: AC

## 2021-01-29 MED ORDER — PRAVASTATIN SODIUM 20 MG PO TABS
20.0000 mg | ORAL_TABLET | Freq: Every day | ORAL | 3 refills | Status: AC
Start: 2021-01-29 — End: ?

## 2021-01-29 MED ORDER — AMLODIPINE BESYLATE 5 MG PO TABS: 5.0000 mg | ORAL_TABLET | Freq: Every day | ORAL | 3 refills | Status: AC

## 2021-01-29 NOTE — Progress Notes (Signed)
Chief Complaint  Patient presents with   Follow-up   F/u  1. Htn improved on norvasc  5mg  qhs and hld + will start pravachol 20 mg qhs lives in [place] now  2. Referral to derm in [place]   Review of Systems  Constitutional:  Negative for weight loss.  HENT:  Negative for hearing loss.   Eyes:  Negative for blurred vision.  Respiratory:  Negative for shortness of breath.   Cardiovascular:  Negative for chest pain.  Gastrointestinal:  Negative for abdominal pain and blood in stool.  Musculoskeletal:  Negative for back pain.  Skin:  Negative for rash.  Neurological:  Negative for headaches.  Psychiatric/Behavioral:  Negative for depression.   Past Medical History:  Diagnosis Date   Chicken pox    Hypertension    Linear scleroderma    Raynaud disease    Varicose vein    right calf   Past Surgical History:  Procedure Laterality Date   NO PAST SURGERIES     Family History  Problem Relation Age of Onset   Asthma Brother        Childhood asthma   Fabry's disease Brother    Arthritis Maternal Grandmother    Cancer Maternal Grandmother        Breast Cancer   Hyperlipidemia Maternal Grandfather    Heart disease Maternal Grandfather    Hypertension Maternal Grandfather    Arthritis Paternal Grandmother    Arthritis Paternal Grandfather    Hyperlipidemia Paternal Grandfather    Clotting disorder Paternal Aunt        after covid d/o    Celiac disease Other    Heart disease Other    Social History   Socioeconomic History   Marital status: Married    Spouse name: Aunchalee   Number of children: 3   Years of education: 16   Highest education level: Not on file  Occupational History   Occupation: Investment banker, operational    Comment: [employer]. [employer]  Tobacco Use   Smoking status: Never   Smokeless tobacco: Never  Substance and Sexual Activity   Alcohol use: Yes    Comment: 2-3 daily   Drug use: No   Sexual activity: Yes    Partners: Female  Other Topics Concern    Not on file  Social History Narrative   Doug [place] up "[place] over". His father was in the [facility] [employer] [facility]. He was born in [place]. He has lived in [place], [place], [place]. He graduated from the [employer] with a Bachelor in [place] with a minor in finance. 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 He currently lives in [place] with wife of 5 years and 3 children (2 boys, 1 girl). Vinnie enjoys Estate agent. He enjoys spending time with the kids. He also enjoys Biomedical engineer. Whenever he has a chance he enjoys running.     Social Determinants of Health   Financial Resource Strain: Not on file  Food Insecurity: Not on file  Transportation Needs: Not on file  Physical Activity: Not on file  Stress: Not on file  Social Connections: Not on file  Intimate Partner Violence: Not on file   Current Meds  Medication Sig   escitalopram (LEXAPRO) 5 MG tablet Take 5 mg by mouth daily.   pravastatin (PRAVACHOL) 20 MG tablet Take 1 tablet (20 mg total) by mouth daily. After 6 pm   No Known Allergies Recent Results (from the past  2160 hour(s))  Comprehensive metabolic panel     Status: None   Collection Time: 01/25/21  9:34 AM  Result Value Ref Range   Sodium 140 135 - 145 mEq/L   Potassium 4.3 3.5 - 5.1 mEq/L   Chloride 104 96 - 112 mEq/L   CO2 29 19 - 32 mEq/L   Glucose, Bld 94 70 - 99 mg/dL   BUN 18 6 - 23 mg/dL   Creatinine, Ser 1.01 0.40 - 1.50 mg/dL   Total Bilirubin 0.5 0.2 - 1.2 mg/dL   Alkaline Phosphatase 61 39 - 117 U/L   AST 24 0 - 37 U/L   ALT 45 0 - 53 U/L   Total Protein 7.1 6.0 - 8.3 g/dL   Albumin 4.6 3.5 - 5.2 g/dL   GFR 92.29 >60.00 mL/min    Comment: Calculated using the CKD-EPI Creatinine Equation (2021)   Calcium 9.6 8.4 - 10.5 mg/dL  Vitamin D (25 hydroxy)     Status: Abnormal   Collection Time: 01/25/21  9:34 AM  Result Value Ref Range   VITD 27.45 (L) 30.00 - 100.00 ng/mL  Hemoglobin A1c     Status: None   Collection Time:  01/25/21  9:34 AM  Result Value Ref Range   Hgb A1c MFr Bld 6.2 4.6 - 6.5 %    Comment: Glycemic Control Guidelines for People with Diabetes:Non Diabetic:  <6%Goal of Therapy: <7%Additional Action Suggested:  >8%   CBC with Differential/Platelet     Status: None   Collection Time: 01/25/21  9:34 AM  Result Value Ref Range   WBC 5.4 4.0 - 10.5 K/uL   RBC 4.74 4.22 - 5.81 Mil/uL   Hemoglobin 14.1 13.0 - 17.0 g/dL   HCT 42.8 39.0 - 52.0 %   MCV 90.3 78.0 - 100.0 fl   MCHC 32.9 30.0 - 36.0 g/dL   RDW 13.2 11.5 - 15.5 %   Platelets 201.0 150.0 - 400.0 K/uL   Neutrophils Relative % 52.3 43.0 - 77.0 %   Lymphocytes Relative 33.6 12.0 - 46.0 %   Monocytes Relative 9.7 3.0 - 12.0 %   Eosinophils Relative 3.8 0.0 - 5.0 %   Basophils Relative 0.6 0.0 - 3.0 %   Neutro Abs 2.8 1.4 - 7.7 K/uL   Lymphs Abs 1.8 0.7 - 4.0 K/uL   Monocytes Absolute 0.5 0.1 - 1.0 K/uL   Eosinophils Absolute 0.2 0.0 - 0.7 K/uL   Basophils Absolute 0.0 0.0 - 0.1 K/uL  Lipid panel     Status: Abnormal   Collection Time: 01/25/21  9:34 AM  Result Value Ref Range   Cholesterol 184 0 - 200 mg/dL    Comment: ATP III Classification       Desirable:  < 200 mg/dL               Borderline High:  200 - 239 mg/dL          High:  > = 240 mg/dL   Triglycerides 71.0 0.0 - 149.0 mg/dL    Comment: Normal:  <150 mg/dLBorderline High:  150 - 199 mg/dL   HDL 43.40 >39.00 mg/dL   VLDL 14.2 0.0 - 40.0 mg/dL   LDL Cholesterol 127 (H) 0 - 99 mg/dL   Total CHOL/HDL Ratio 4     Comment:                Men          Women1/2 Average Risk     3.4  3.3Average Risk          5.0          4.42X Average Risk          9.6          7.13X Average Risk          15.0          11.0                       NonHDL 140.99     Comment: NOTE:  Non-HDL goal should be 30 mg/dL higher than patient's LDL goal (i.e. LDL goal of < 70 mg/dL, would have non-HDL goal of < 100 mg/dL)  TSH     Status: None   Collection Time: 01/25/21  9:34 AM  Result Value Ref  Range   TSH 2.44 0.35 - 5.50 uIU/mL  Urinalysis, Routine w reflex microscopic     Status: None   Collection Time: 01/25/21  9:34 AM  Result Value Ref Range   Color, Urine YELLOW YELLOW   APPearance CLEAR CLEAR   Specific Gravity, Urine 1.018 1.001 - 1.035   pH 5.5 5.0 - 8.0   Glucose, UA NEGATIVE NEGATIVE   Bilirubin Urine NEGATIVE NEGATIVE   Ketones, ur NEGATIVE NEGATIVE   Hgb urine dipstick NEGATIVE NEGATIVE   Protein, ur NEGATIVE NEGATIVE   Nitrite NEGATIVE NEGATIVE   Leukocytes,Ua NEGATIVE NEGATIVE   Objective  There is no height or weight on file to calculate BMI. Wt Readings from Last 3 Encounters:  08/21/20 213 lb 6.4 oz (96.8 kg)  06/29/20 207 lb (93.9 kg)  05/10/20 208 lb (94.3 kg)   Temp Readings from Last 3 Encounters:  08/21/20 98.1 F (36.7 C) (Oral)  06/29/20 98.1 F (36.7 C) (Oral)  05/10/20 97.9 F (36.6 C)   BP Readings from Last 3 Encounters:  08/21/20 126/84  06/29/20 108/76  05/10/20 (!) 142/78   Pulse Readings from Last 3 Encounters:  08/21/20 75  06/29/20 68  05/10/20 77    Physical Exam Vitals and nursing note reviewed.  Constitutional:      Appearance: Normal appearance. He is well-developed and well-groomed. He is obese.  HENT:     Head: Normocephalic and atraumatic.  Eyes:     Conjunctiva/sclera: Conjunctivae normal.     Pupils: Pupils are equal, round, and reactive to light.  Cardiovascular:     Rate and Rhythm: Normal rate and regular rhythm.     Heart sounds: Normal heart sounds.  Pulmonary:     Effort: Pulmonary effort is normal. No respiratory distress.     Breath sounds: Normal breath sounds.  Abdominal:     Tenderness: There is no abdominal tenderness.  Skin:    General: Skin is warm and moist.  Neurological:     General: No focal deficit present.     Mental Status: He is alert and oriented to person, place, and time. Mental status is at baseline.     Sensory: Sensation is intact.     Motor: Motor function is intact.      Coordination: Coordination is intact.     Gait: Gait is intact. Gait normal.  Psychiatric:        Attention and Perception: Attention and perception normal.        Mood and Affect: Mood and affect normal.        Speech: Speech normal.        Behavior: Behavior normal. Behavior is cooperative.  Thought Content: Thought content normal.        Cognition and Memory: Cognition and memory normal.        Judgment: Judgment normal.    Assessment  Plan  Hyperlipidemia, unspecified hyperlipidemia type - Plan: pravastatin (PRAVACHOL) 20 MG tablet  Skin cancer screening - Plan: Ambulatory referral to Dermatology  Peptic duodenitis - Plan: pantoprazole (PROTONIX) 40 MG tablet Chronic gastritis without bleeding, unspecified gastritis type - Plan: pantoprazole (PROTONIX) 40 MG tablet  Essential hypertensionimproved - Plan: amLODipine (NORVASC) 5 MG tablet, Comprehensive metabolic panel, Lipid panel, CBC with Differential/Platelet  Fatigue, unspecified type - Plan: Comprehensive metabolic panel, CBC with Differential/Platelet, Iron, TIBC and Ferritin Panel On lexapro 5 mg continue to f/u therapy  Vit D low rec D3 4000 to 5000 IU daily  Prediabetes - Plan: Hemoglobin A1c  Iron deficiency - Plan: Iron, TIBC and Ferritin Panel  Vitamin D deficiency - Plan: Vitamin D (25 hydroxy)   HM Flu utd 10/2020  Tdap up to date  covid 4/4 last 12/2020 moderna Hep B immune    Labs fasting 06/2020, 01/2021  rec increase exercise and healthy diet choices  Referral [facility] 01/02/20 bx neg  DERM referred today Cdc   Pt not smoking  rec healthy diet and exercise  Moved to [place]    Provider: Dr. Olivia Mackie McLean-Scocuzza-[facility]

## 2021-01-29 NOTE — Patient Instructions (Addendum)
Vitamin D low rec D3 4000-5000 IU daily otc   Fatigue If you have fatigue, you feel tired all the time and have a lack of energy or a lack of motivation. Fatigue may make it difficult to start or complete tasks because of exhaustion. In general, occasional or mild fatigue is often a normal response to activity or life. However, long-lasting (chronic) or extreme fatigue may be a symptom of a medical condition. Follow these instructions at home: General instructions Watch your fatigue for any changes. Go to bed and get up at the same time every day. Avoid fatigue by pacing yourself during the day and getting enough sleep at night. Maintain a healthy weight. Medicines Take over-the-counter and prescription medicines only as told by your health care provider. Take a multivitamin, if told by your health care provider.  Do not use herbal or dietary supplements unless they are approved by your health care provider. Activity  Exercise regularly, as told by your health care provider. Use or practice techniques to help you relax, such as yoga, tai chi, meditation, or massage therapy. Eating and drinking  Avoid heavy meals in the evening. Eat a well-balanced diet, which includes lean proteins, whole grains, plenty of fruits and vegetables, and low-fat dairy products. Avoid consuming too much caffeine. Avoid the use of alcohol. Drink enough fluid to keep your urine pale yellow. Lifestyle Change situations that cause you stress. Try to keep your work and personal schedule in balance. Do not use any products that contain nicotine or tobacco, such as cigarettes and e-cigarettes. If you need help quitting, ask your health care provider. Do not use drugs. Contact a health care provider if: Your fatigue does not get better. You have a fever. You suddenly lose or gain weight. You have headaches. You have trouble falling asleep or sleeping through the night. You feel angry, guilty, anxious, or  sad. You are unable to have a bowel movement (constipation). Your skin is dry. You have swelling in your legs or another part of your body. Get help right away if: You feel confused. Your vision is blurry. You feel faint or you pass out. You have a severe headache. You have severe pain in your abdomen, your back, or the area between your waist and hips (pelvis). You have chest pain, shortness of breath, or an irregular or fast heartbeat. You are unable to urinate, or you urinate less than normal. You have abnormal bleeding, such as bleeding from the rectum, vagina, nose, lungs, or nipples. You vomit blood. You have thoughts about hurting yourself or others. If you ever feel like you may hurt yourself or others, or have thoughts about taking your own life, get help right away. You can go to your nearest emergency department or call: Your local emergency services (911 in the [place] A suicide crisis helpline, such as the [place] at (917)567-9379 or 988 in the [place]. This is open 24 hours a day. Summary If you have fatigue, you feel tired all the time and have a lack of energy or a lack of motivation. Fatigue may make it difficult to start or complete tasks because of exhaustion. Long-lasting (chronic) or extreme fatigue may be a symptom of a medical condition. Exercise regularly, as told by your health care provider. Change situations that cause you stress. Try to keep your work and personal schedule in balance. This information is not intended to replace advice given to you by your health care provider. Make sure you discuss  any questions you have with your health care provider. Document Revised: 08/29/2020 Document Reviewed: 12/15/2019 Elsevier Patient Education  2022 [employer].

## 2021-07-02 ENCOUNTER — Encounter: Payer: BC Managed Care – PPO | Admitting: Internal Medicine

## 2022-02-05 ENCOUNTER — Telehealth: Payer: Self-pay

## 2022-02-05 NOTE — Telephone Encounter (Signed)
Left a detailed msg on pts machine in regards to an Rx refill request for pantoprazole pt has not been scheduled a toc with a new provider and has not been seen since 01/29/2021.   LMOM stating he will need to CB to get scheduled to get refill.

## 2022-02-14 ENCOUNTER — Telehealth: Payer: Self-pay

## 2022-02-14 NOTE — Telephone Encounter (Signed)
LMOM to CB to get scheduled for a TOC appt as pt no longer has a PCP and we received a refill request via fax for pantopazole.

## 2022-06-09 IMAGING — CT CT ABD-PELV W/ CM
2 of 4 series · 16 of 46 positions shown, 18 images · IV contrast (omnipaque)
Comparison: None.

CLINICAL DATA: Acute right-sided abdominal pain.

EXAM:
CT ABDOMEN AND PELVIS WITH CONTRAST
TECHNIQUE: Multidetector CT imaging of the abdomen and pelvis was performed
using the standard protocol following bolus administration of
intravenous contrast.
CONTRAST:  100mL OMNIPAQUE IOHEXOL 300 MG/ML  SOLN

[Series 2: abd pelvis 5.00 · axial · 0.86mm/px · z∈[-1635,-1140]mm · 13 of 109 slices shown, 15 images]
[im 5/109  soft-tissue]
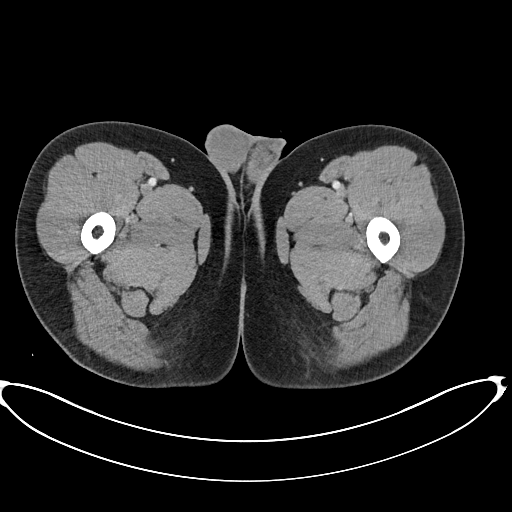
[im 5/109  bone]
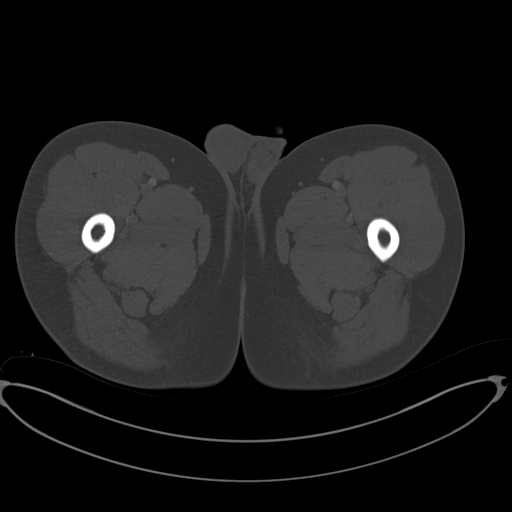
[im 15/109  soft-tissue]
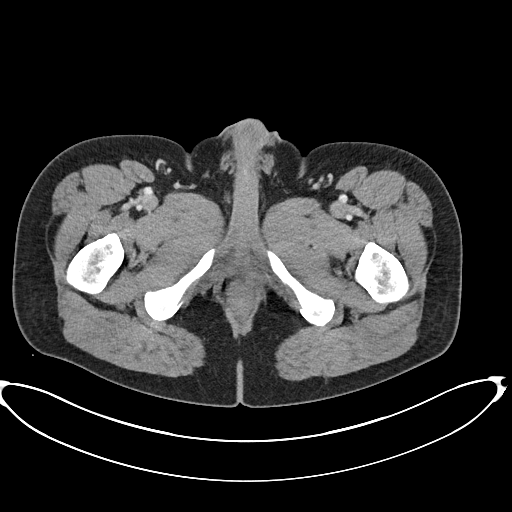
[im 24/109  soft-tissue]
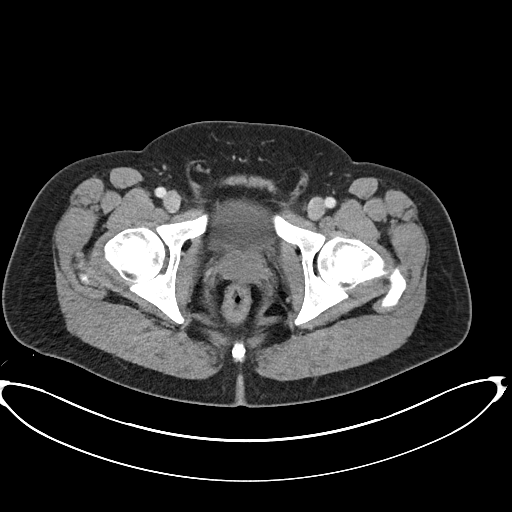
[im 29/109  soft-tissue]
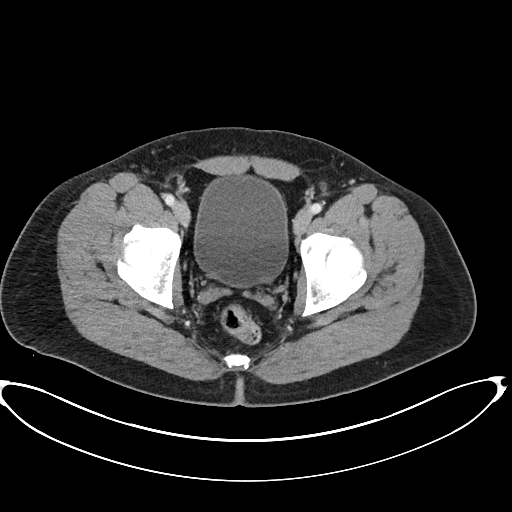
[im 38/109  soft-tissue]
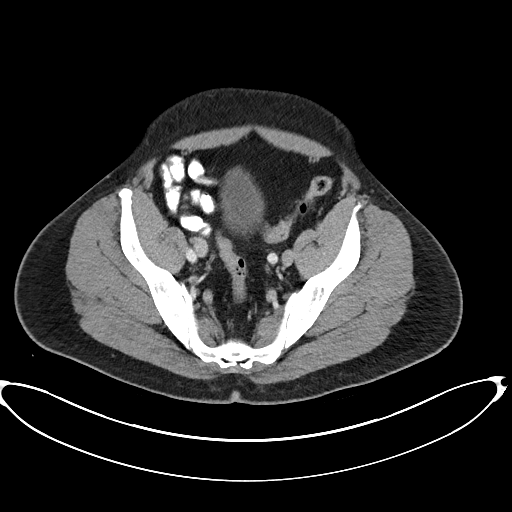
[im 47/109  soft-tissue]
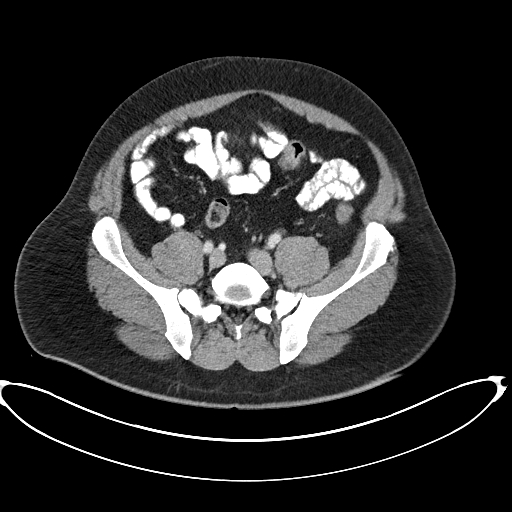
[im 57/109  soft-tissue]
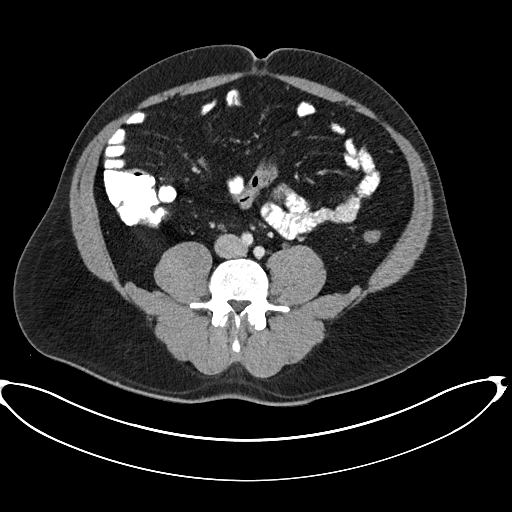
[im 62/109  soft-tissue]
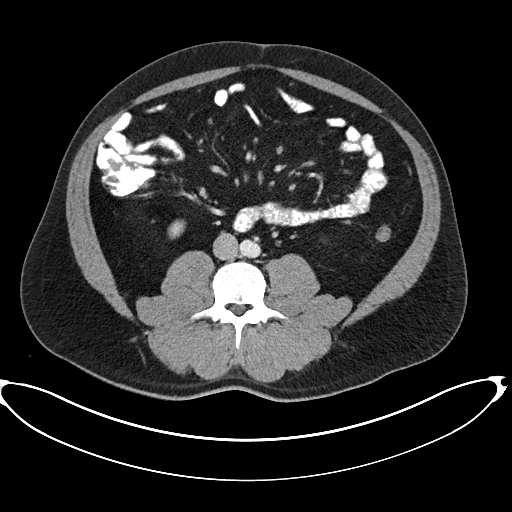
[im 71/109  soft-tissue]
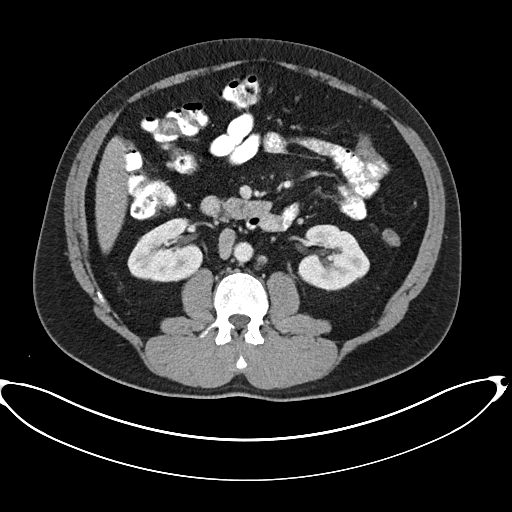
[im 71/109  bone]
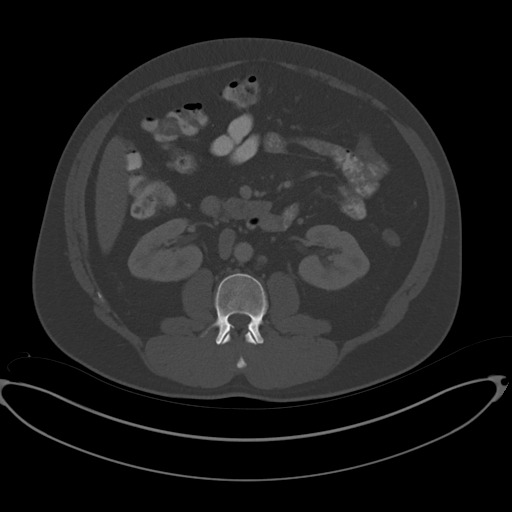
[im 80/109  soft-tissue]
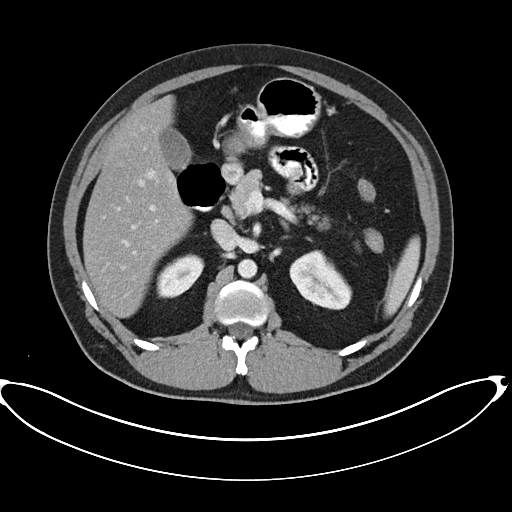
[im 85/109  soft-tissue]
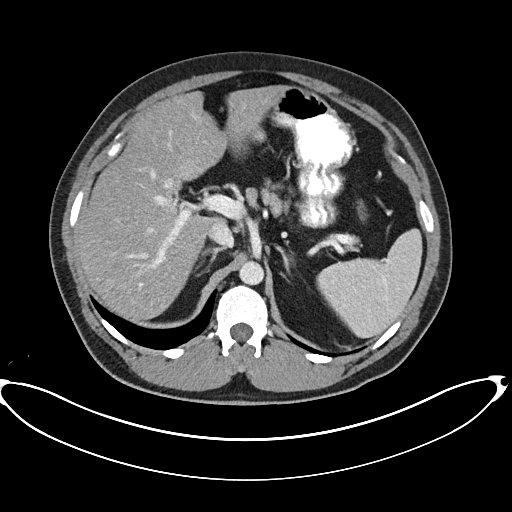
[im 94/109  soft-tissue]
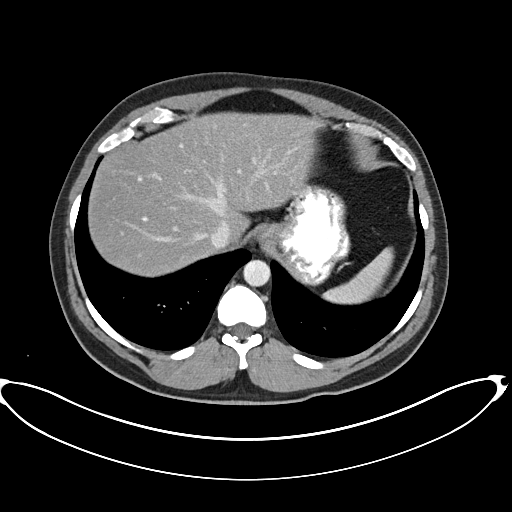
[im 104/109  soft-tissue]
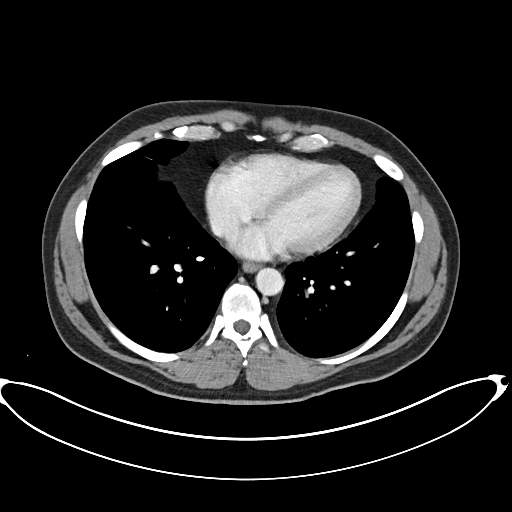

[Series 4: coronals abd pelvis 2.00 cor · coronal · 0.81mm/px · 3 of 161 slices shown]
[im 54/161  soft-tissue]
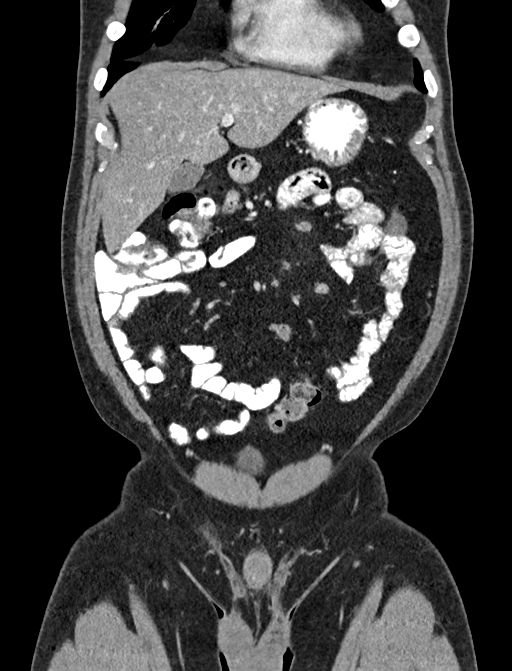
[im 72/161  soft-tissue]
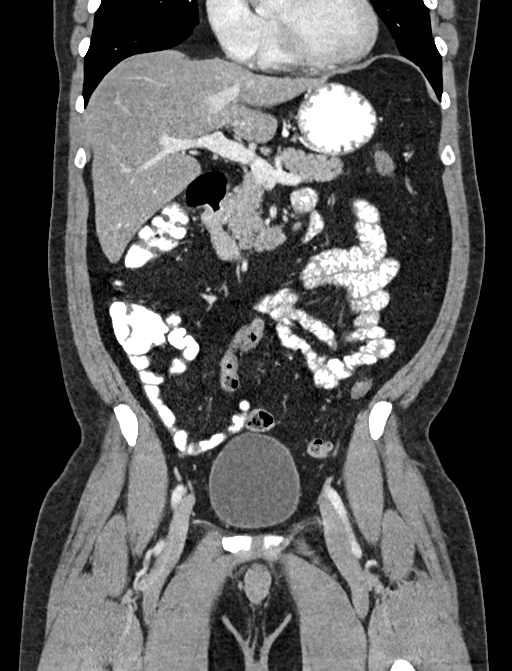
[im 89/161  soft-tissue]
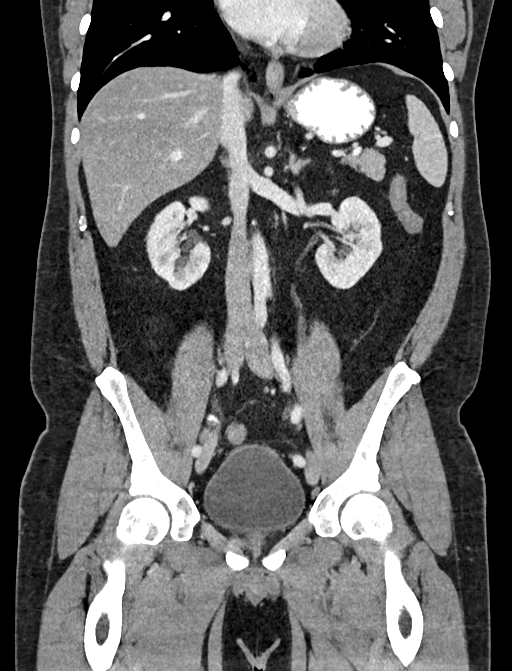

[16 of 46 positions shown; findings below may reference images not displayed]

FINDINGS: Lower chest: No acute abnormality.

Hepatobiliary: No focal liver abnormality is seen. No gallstones,
gallbladder wall thickening, or biliary dilatation.

Pancreas: Unremarkable. No pancreatic ductal dilatation or
surrounding inflammatory changes.

Spleen: Normal in size without focal abnormality.

Adrenals/Urinary Tract: Adrenal glands are unremarkable. Kidneys are
normal, without renal calculi, focal lesion, or hydronephrosis.
Bladder is unremarkable.

Stomach/Bowel: Stomach is within normal limits. Appendix appears
normal. No evidence of bowel wall thickening, distention, or
inflammatory changes.

Vascular/Lymphatic: No significant vascular findings are present. No
enlarged abdominal or pelvic lymph nodes.

Reproductive: Prostate is unremarkable.

Other: No abdominal wall hernia or abnormality. No abdominopelvic
ascites.

Musculoskeletal: No acute or significant osseous findings.
IMPRESSION: No abnormality seen in the abdomen or pelvis.
